# Patient Record
Sex: Female | Born: 1975 | Hispanic: Yes | Marital: Single | Smoking: Never smoker
Health system: Southern US, Community
[De-identification: ages and names within clinical notes are randomized; demographics above are authoritative.]

## PROBLEM LIST (undated history)

## (undated) DIAGNOSIS — R87629 Unspecified abnormal cytological findings in specimens from vagina: Secondary | ICD-10-CM

## (undated) HISTORY — DX: Unspecified abnormal cytological findings in specimens from vagina: R87.629

---

## 2021-07-07 ENCOUNTER — Other Ambulatory Visit (HOSPITAL_BASED_OUTPATIENT_CLINIC_OR_DEPARTMENT_OTHER): Payer: Self-pay

## 2021-07-07 DIAGNOSIS — Z1231 Encounter for screening mammogram for malignant neoplasm of breast: Secondary | ICD-10-CM

## 2021-08-13 ENCOUNTER — Other Ambulatory Visit (HOSPITAL_BASED_OUTPATIENT_CLINIC_OR_DEPARTMENT_OTHER): Payer: Self-pay | Admitting: Physician Assistant

## 2021-08-13 DIAGNOSIS — Z1231 Encounter for screening mammogram for malignant neoplasm of breast: Secondary | ICD-10-CM

## 2021-08-16 ENCOUNTER — Encounter (HOSPITAL_BASED_OUTPATIENT_CLINIC_OR_DEPARTMENT_OTHER): Payer: Self-pay

## 2021-08-16 ENCOUNTER — Encounter (HOSPITAL_BASED_OUTPATIENT_CLINIC_OR_DEPARTMENT_OTHER): Payer: Medicaid Other

## 2021-08-16 ENCOUNTER — Ambulatory Visit (HOSPITAL_BASED_OUTPATIENT_CLINIC_OR_DEPARTMENT_OTHER)
Admission: RE | Admit: 2021-08-16 | Discharge: 2021-08-16 | Disposition: A | Discharge status: Home / Self Care | Payer: Medicaid Other | Source: Ambulatory Visit | Attending: Physician Assistant | Admitting: Physician Assistant

## 2021-08-16 ENCOUNTER — Other Ambulatory Visit: Payer: Self-pay

## 2021-08-16 DIAGNOSIS — Z1231 Encounter for screening mammogram for malignant neoplasm of breast: Secondary | ICD-10-CM | POA: Diagnosis present

## 2021-08-25 ENCOUNTER — Ambulatory Visit (INDEPENDENT_AMBULATORY_CARE_PROVIDER_SITE_OTHER): Payer: Medicaid Other | Admitting: Obstetrics & Gynecology

## 2021-08-25 ENCOUNTER — Encounter: Payer: Self-pay | Admitting: Obstetrics & Gynecology

## 2021-08-25 ENCOUNTER — Other Ambulatory Visit: Payer: Self-pay

## 2021-08-25 VITALS — BP 113/68 | HR 89 | Ht 63.0 in | Wt 193.0 lb

## 2021-08-25 DIAGNOSIS — Z3009 Encounter for other general counseling and advice on contraception: Secondary | ICD-10-CM | POA: Diagnosis not present

## 2021-08-25 DIAGNOSIS — N814 Uterovaginal prolapse, unspecified: Secondary | ICD-10-CM | POA: Diagnosis not present

## 2021-08-25 DIAGNOSIS — Z01419 Encounter for gynecological examination (general) (routine) without abnormal findings: Secondary | ICD-10-CM

## 2021-08-25 DIAGNOSIS — N812 Incomplete uterovaginal prolapse: Secondary | ICD-10-CM

## 2021-08-25 NOTE — Patient Instructions (Signed)
Pelvic Organ Prolapse Pelvic organ prolapse is a condition in women that involves the stretching, bulging, or dropping of pelvic organs into an abnormal position, past the opening of the vagina. It happens when the muscles and tissues that surround and support pelvic structures become weak or stretched. Pelvic organ prolapse can involve the: Vagina (vaginal prolapse). Uterus (uterine prolapse). Bladder (cystocele). Rectum (rectocele). Intestines (enterocele). When organs other than the vagina are involved, they often bulge into thevagina or protrude from the vagina, depending on how severe the prolapse is. What are the causes? This condition may be caused by: Pregnancy, labor, and childbirth. Past pelvic surgery. Lower levels of the hormone estrogen due to menopause. Consistently lifting more than 50 lb (23 kg). Obesity. Long-term difficulty passing stool (chronic constipation). Long-term, or chronic, cough. Fluid buildup in the abdomen due to certain conditions. What are the signs or symptoms? Symptoms of this condition include: Leaking a little urine (loss of bladder control) when you cough, sneeze, strain, and exercise (stress incontinence). This may be worse immediately after childbirth. It may gradually improve over time. Feeling pressure in your pelvis or vagina. This pressure may increase when you cough or when you are passing stool. A bulge that protrudes from the opening of your vagina. Difficulty passing urine or stool. Pain in your lower back. Pain or discomfort during sex, or decreased interest in sex. Repeated bladder infections (urinary tract infections). Difficulty inserting a tampon. In some people, this condition causes no symptoms. How is this diagnosed? This condition may be diagnosed based on a vaginal and rectal exam. During the exam, you may be asked to cough and strain while you are lying down, sitting, and standing up. Your health care provider will determine if  other tests arerequired, such as bladder function tests. How is this treated? Treatment for this condition may depend on your symptoms. Treatment may include: Lifestyle changes, such as drinking plenty of fluids and eating foods that are high in fiber. Emptying your bladder at scheduled times (bladder training therapy). This can help reduce or avoid urinary incontinence. Estrogen. This may help mild prolapse by increasing the strength and tone of pelvic floor muscles. Kegel exercises. These may help mild cases of prolapse by strengthening and tightening the muscles of the pelvic floor. A soft, flexible device that helps support the vaginal walls and keep pelvic organs in place (pessary). This is inserted into your vagina by your health care provider. Surgery. This is often the only form of treatment for severe prolapse. Follow these instructions at home: Eating and drinking Avoid drinking beverages that contain caffeine or alcohol. Increase your intake of high-fiber foods to decrease constipation and straining during bowel movements. Activity Lose weight if recommended by your health care provider. Avoid heavy lifting and straining with exercise and work. Do not hold your breath when you perform mild to moderate lifting and exercise activities. Limit your activities as directed by your health care provider. Do Kegel exercises as directed by your health care provider. To do this: Squeeze your pelvic floor muscles tight. You should feel a tight lift in your rectal area and a tightness in your vaginal area. Keep your stomach, buttocks, and legs relaxed. Hold the muscles tight for up to 10 seconds. Then relax your muscles. Repeat this exercise 50 times a day, or as much as told by your health care provider. Continue to do this exercise for at least 4-6 weeks, or for as long as told by your health care provider. General instructions   Take over-the-counter and prescription medicines only as told by  your health care provider. Wear a sanitary pad or adult diapers if you have urinary incontinence. If you have a pessary, take care of it as told by your health care provider. Keep all follow-up visits. This is important. Contact a health care provider if you: Have symptoms that interfere with your daily activities or sex life. Need medicine to help with the discomfort. Notice bleeding from your vagina that is not related to your menstrual period. Have a fever. Have pain or bleeding when you urinate. Have bleeding when you pass stool. Pass urine when you have sex. Have chronic constipation. Have a pessary that falls out. Have a foul-smelling vaginal discharge. Have an unusual, low pain in your abdomen. Get help right away if you: Cannot pass urine. Summary Pelvic organ prolapse is the stretching, bulging, or dropping of pelvic organs into an abnormal position. It happens when the muscles and tissues that surround and support pelvic structures become weak or stretched. When organs other than the vagina are involved, they often bulge into the vagina or protrude from it, depending on how severe the prolapse is. In most cases, this condition needs to be treated only if it produces symptoms. Treatment may include lifestyle changes, estrogen, Kegel exercises, pessary insertion, or surgery. Avoid heavy lifting and straining with exercise and work. Do not hold your breath when you perform mild to moderate lifting and exercise activities. Limit your activities as directed by your health care provider. This information is not intended to replace advice given to you by your health care provider. Make sure you discuss any questions you have with your healthcare provider. Document Revised: 02/24/2020 Document Reviewed: 02/24/2020 Elsevier Patient Education  2022 [employer]  

## 2021-08-25 NOTE — Progress Notes (Signed)
Patient up to date on her mammogram (08-16-21). Armandina Stammer RN

## 2021-08-25 NOTE — Progress Notes (Signed)
Subjective:     Cathy Collins is a 45 y.o. female here for a routine exam.  She reports monthly cycles that are not bothersome. Pt is in a monogamous relationship for many years. She recently moved here from [place]  with her partner and 2 sons. She denies GYN issues. She does reports a prior h/o 'feeling something' from vagina prev. No h/o incontinence or AUB.     She is s/p SVD x2. Pt had PAP 07/2021 with primary care provider. She still wants an exam by GYN.     Gynecologic History Patient's last menstrual period was 08/01/2021 (exact date). Contraception:  occ condoms and 'being careful' Last Pap: 07/16/2021. Results were: normal Last mammogram: 06/16/2021. Results were: normal  Obstetric History [id number]   The following portions of the patient's history were reviewed and updated as appropriate: allergies, current medications, past family history, past medical history, past social history, past surgical history, and problem list.  Review of Systems Pertinent items are noted in HPI.    Objective:  BP 113/68    Pulse 89    Ht 5\' 3"  (1.6 m)    Wt 193 lb (87.5 kg)    LMP 08/01/2021 (Exact Date)    BMI 34.19 kg/m   General Appearance:    Alert, cooperative, no distress, appears stated age  Head:    Normocephalic, without obvious abnormality, atraumatic  Eyes:    conjunctiva/corneas clear, EOM's intact, both eyes  Ears:    Normal external ear canals, both ears  Nose:   Nares normal, septum midline, mucosa normal, no drainage    or sinus tenderness  Throat:   Lips, mucosa, and tongue normal; teeth and gums normal  Neck:   Supple, symmetrical, trachea midline, no adenopathy;    thyroid:  no enlargement/tenderness/nodules  Back:     Symmetric, no curvature, ROM normal, no CVA tenderness  Lungs:     respirations unlabored  Chest Wall:    No tenderness or deformity   Heart:    Regular rate and rhythm  Breast Exam:    No tenderness, masses, or nipple abnormality  Abdomen:     Soft, non-tender,  bowel sounds active all four quadrants,    no masses, no organomegaly  Genitalia:    Normal female without lesion, discharge or tenderness   Pt with stage II uterine prolapse. She has a cystocele that is visible through the introitus.    Extremities:   Extremities normal, atraumatic, no cyanosis or edema  Pulses:   2+ and symmetric all extremities  Skin:   Skin color, texture, turgor normal, no rashes or lesions     Assessment:    Healthy female exam.  Asymptomatic pelvic organ prolapse. Reviewed with pt management options briefly should sx develop.  Contraception counseling- reviewed with pt that she is still able to get pregnant.     Plan:  Nhu was seen today for gynecologic exam.  Diagnoses and all orders for this visit:  Well female exam with routine gynecological exam  First degree uterine prolapse  Cystocele with prolapse   Need to obtain results for PAP 07/2021  Simmone Cape L. Harraway-Smith, M.D., 08/2021

## 2021-09-15 ENCOUNTER — Encounter: Payer: Self-pay | Admitting: General Practice

## 2021-10-24 NOTE — Therapy (Signed)
OUTPATIENT PHYSICAL THERAPY THORACOLUMBAR EVALUATION   Patient Name: Cathy Collins MRN: [id number] DOB:March 19, 1976, 46 y.o., female Today's Date: 10/26/2021   PT End of Session - 10/26/21 1233     Visit Number 1    Date for PT Re-Evaluation 12/21/21    Authorization Type [employer]    Authorization - Visit Number 1    Authorization - Number of Visits 27    PT Start Time 1015    PT Stop Time 1100    PT Time Calculation (min) 45 min              History reviewed. No pertinent surgical history.       REFERRING PROVIDER: Rick Duff, PA-C  REFERRING DIAG: M54.59 (ICD-10-CM) - Other low back pain   THERAPY DIAG:  Chronic bilateral low back pain without sciatica  Abnormal posture  Muscle weakness (generalized)  Cramp and spasm  ONSET DATE: chronic, worse over past few months  SUBJECTIVE:                                                                                                                                                                                           SUBJECTIVE STATEMENT: Pt is a 46yo female referred to OPPT for LBP.  Pain has worsened over past few months.  No known injury.  Pain is mildine and spreads bil Lt>Rt, into buttock and posterior thigh.  Worse with being static, sitting or walking > 20 min, bending/lifting.  Pt is getting lumbar MRI following recent lumbar x-rays.  PERTINENT HISTORY:  Pelvic organ prolapse Stage 2, recently diagnosed, no treatment to date  PAIN:  Are you having pain? Yes NPRS scale: 4-8/10 Pain location: lumbar, buttocks, posterior thighs Pain orientation: Bilateral  PAIN TYPE: aching and sharp Pain description: constant  Aggravating factors: too much walking, bending, sitting, being too still then moving Relieving factors: rubbing my back gives short term relief, ibuprofen does not help  PRECAUTIONS: None  WEIGHT BEARING RESTRICTIONS No  FALLS:  Has patient fallen in last 6 months? No  LIVING  ENVIRONMENT: Lives with: lives with their family Lives in: House/apartment Stairs: Yes; external flight to enter level apartment Has following equipment at home: None  OCCUPATION: does not work due to pain, relocated 1.5 years ago  PLOF: Independent  PATIENT GOALS  reduce pain, manage pain   OBJECTIVE:   DIAGNOSTIC FINDINGS:  Lumbar xrays 10/21/2021: Mild disc space narrowing present at L2-3 and L3-4 with minimal associated vertebral body spurring. Mild lower facet arthrosis greatest at L4-5 and L5-S1   PATIENT SURVEYS:  FOTO 46%, goal 56%    COGNITION:  Overall  cognitive status: Within functional limits for tasks assessed     SENSATION:  Light touch: Appears intact    MUSCLE LENGTH: Hamstrings limited end range bil Piriformis limited 30% bil Gluteals limited 30% bil Quads limited 30% bil   POSTURE:  Decreased thoracic kyphosis, increased lumbar lordosis, anterior pelvic tilt  PALPATION: Tender bil lumbar paraspinals, QL, SI joints, glut max, glut med.  Spasm present along bil lumbar spine.  LUMBARAROM/PROM  A/PROM A/PROM  10/26/2021  Flexion Fingers to ground, no pain  Extension Full, excessive, no pain  Right lateral flexion Full, Rt sided pain  Left lateral flexion Full, Rt sided stretch  Right rotation Full no pain  Lt rotation Full, no pain   (Blank rows = not tested)  LE AROM/PROM:   Rt hip limited ER/IR compared to Lt, with pain   LE MMT:  MMT Right 10/26/2021 Left 10/26/2021  Hip flexion 3+ 3+  Hip extension 4 4  Hip abduction 3+ 4-  Hip adduction 4- 4-  Hip internal rotation 4- 4-  Hip external rotation 4 4  Knee flexion 4 4  Knee extension 4 4  Ankle dorsiflexion 4 4  Ankle plantarflexion    Ankle inversion    Ankle eversion     (Blank rows = not tested)  LUMBAR SPECIAL TESTS:  SLR negative bil, FABER + bil, SLS negative (able to balance, no Trendelenburg)  FUNCTIONAL TESTS:  Squat: able to perform 50% squat, with  LBP  GAIT:  Assistive device utilized: None Level of assistance: Complete Independence Comments: gait WNL    TODAY'S TREATMENT  Attempted exploring supine hip and LE stretches, quadruped stretches and spine ROM, and seated hamstring stretch but Pt reported pain and hesitancy to go home with HEP given pain.  She inquired about how recent Dx of pelvic organ prolapse may affect her pain and exercises.  Discussed how ortho PT can work indirectly with breath/pressure management and verbal cueing for core and pelvic floor contractions and if prolapse symptoms interfere or worsens she may benefit from pelvic PT.  Gave Pt dry needling info given palpable spasms along lumbar spine as an option for treatment next time.   PATIENT EDUCATION:  Education details: DN info Person educated: Patient Education method: Handouts Education comprehension: verbalized understanding   HOME EXERCISE PROGRAM: Start next time, gave DN info  ASSESSMENT:  CLINICAL IMPRESSION: Patient is a 46 y.o. female who was seen today for physical therapy evaluation and treatment for chronic and worsening LBP. Objective impairments include decreased knowledge of condition, decreased ROM, decreased strength, increased muscle spasms, impaired flexibility, postural dysfunction, and pain. These impairments are limiting patient from cleaning, community activity, laundry, yard work, shopping, and yard work. Personal factors including Fitness, Profession, Time since onset of injury/illness/exacerbation, and 1 comorbidity: pelvic organ prolapse  are also affecting patient's functional outcome. Patient will benefit from skilled PT to address above impairments and improve overall function.  REHAB POTENTIAL: Good  CLINICAL DECISION MAKING: Stable/uncomplicated  EVALUATION COMPLEXITY: Low   GOALS: Goals reviewed with patient? Yes  SHORT TERM GOALS:  STG Name Target Date Goal status  1 Pt will be able to demo proper understanding  of how to engage core in various positions and report success with pelvic floor lift with verbal cueing by PT Baseline:  11/16/2021 INITIAL  2 Pt will be ind with initial HEP to improve LE flexibility, LE strength, and core strength. Baseline:  11/23/2021 INITIAL  3 Pt will report reduced pain by at least 20%  with sitting, standing tasks, and walking x 30' or more. Baseline: 20 min sitting and standing  11/23/2021 INITIAL  4  Baseline:    5  Baseline:    6  Baseline:    7  Baseline:     LONG TERM GOALS:   LTG Name Target Date Goal status  1 Pt will be ind with advanced HEP and understand how to safely progress Baseline: 12/21/2021 INITIAL  2 Pt will report at least 70% improvement in tolerance of light to moderate household tasks. Baseline: 12/21/2021 INITIAL  3 Pt will improve LE strength and core strength to at least 4+/5 bil for improved squat, stairs, and gait endurance. Baseline: 12/21/2021 INITIAL  4 Pt able to demo proper bend/lift body mechanics without pain for household tasks and shopping. Baseline: 12/21/2021 INITIAL  5 Improve FOTO to at least 56% to demo improved function. Baseline: 46% 12/21/2021 INITIAL  6  Baseline:    7  Baseline:     PLAN: PT FREQUENCY: 2x/week  PT DURATION: 8 weeks  PLANNED INTERVENTIONS: Therapeutic exercises, Therapeutic activity, Neuro Muscular re-education, Balance training, Gait training, Patient/Family education, Joint mobilization, Dry Needling, Electrical stimulation, Spinal mobilization, Cryotherapy, Moist heat, Taping, and Manual therapy  PLAN FOR NEXT SESSION: NuStep, intro core in SL, include pelvic floor cues, quad/hip/hamstring stretches as tol, LE strength, DN lumbar spine if Pt ok with this (handout given)   Demetric Dunnaway, PT 10/26/21 1:02 PM

## 2021-10-26 ENCOUNTER — Other Ambulatory Visit: Payer: Self-pay

## 2021-10-26 ENCOUNTER — Ambulatory Visit: Payer: Medicaid Other | Attending: Physician Assistant | Admitting: Physical Therapy

## 2021-10-26 ENCOUNTER — Encounter: Payer: Self-pay | Admitting: Physical Therapy

## 2021-10-26 DIAGNOSIS — M5459 Other low back pain: Secondary | ICD-10-CM | POA: Diagnosis not present

## 2021-10-26 DIAGNOSIS — R293 Abnormal posture: Secondary | ICD-10-CM

## 2021-10-26 DIAGNOSIS — G8929 Other chronic pain: Secondary | ICD-10-CM

## 2021-10-26 DIAGNOSIS — R252 Cramp and spasm: Secondary | ICD-10-CM

## 2021-10-26 DIAGNOSIS — M545 Low back pain, unspecified: Secondary | ICD-10-CM

## 2021-10-26 DIAGNOSIS — M6281 Muscle weakness (generalized): Secondary | ICD-10-CM

## 2021-10-26 NOTE — Patient Instructions (Signed)

## 2021-10-26 NOTE — Addendum Note (Signed)
Addended by: Nathaneil Canary on: 10/26/2021 01:09 PM   Modules accepted: Orders

## 2021-11-02 NOTE — Therapy (Signed)
OUTPATIENT PHYSICAL THERAPY TREATMENT NOTE   Patient Name: Cathy Collins MRN: [id number] DOB:[birth year], 46 y.o., female Today's Date: 11/03/2021  PCP: [facility] REFERRING PROVIDER: [facility]   PT End of Session - 11/03/21 1111     Visit Number 2    Date for PT Re-Evaluation 12/21/21    Authorization Type [employer] Medicaid    Authorization - Visit Number 2    Authorization - Number of Visits 27    PT Start Time 1111   Pt late   PT Stop Time 1145    PT Time Calculation (min) 34 min    Activity Tolerance Patient tolerated treatment well    Behavior During Therapy WFL for tasks assessed/performed             Past Medical History:  Diagnosis Date   Vaginal Pap smear, abnormal    History reviewed. No pertinent surgical history. There are no problems to display for this patient.   REFERRING DIAG: M54.59 (ICD-10-CM) - Other low back pain   THERAPY DIAG:  Chronic bilateral low back pain without sciatica   Abnormal posture   Muscle weakness (generalized)   Cramp and spasm  PERTINENT HISTORY: Pelvic organ prolapse Stage 2, recently diagnosed, no treatment to date   PRECAUTIONS: none  SUBJECTIVE: I did some stretching on my own over the weekend and did ok.  I continue to limit my activity due to pain.  Iboprofen has been helping. Muscle relaxers give me some side effects so only used a few times.  PAIN:  Are you having pain? Yes NPRS scale: 4/10 Pain location: lumbar Pain orientation: Left  PAIN TYPE: aching Pain description: aching  Aggravating factors: activity Relieving factors: unsure, limiting activity    TODAY'S TREATMENT   11/03/2021: Pt education/self-care:  Education about anatomy and role of pelvic floor and deep core importance of pressure management and proper core engagement before progression of dynamic strength training to protect ongoing pelvic organ prolapse Diaphragmatic breathing intro Neuro-reed:  standing and  SL diaphragmatic breathing with VC and TC Add TA indraw and PF lift, VC used and PT used external palpation of posterior PF to validate proper lift with Pt's permission HEP: work on diaphragmatic breathing with TA/PF activation on exhale in variety of positions, can use self-palpation for feedback as instructed by PT  10/26/2021: Attempted exploring supine hip and LE stretches, quadruped stretches and spine ROM, and seated hamstring stretch but Pt reported pain and hesitancy to go home with HEP given pain.  She inquired about how recent Dx of pelvic organ prolapse may affect her pain and exercises.  Discussed how ortho PT can work indirectly with breath/pressure management and verbal cueing for core and pelvic floor contractions and if prolapse symptoms interfere or worsens she may benefit from pelvic PT.  Gave Pt dry needling info given palpable spasms along lumbar spine as an option for treatment next time.     PATIENT EDUCATION:  Education details: DN info Person educated: Patient Education method: Handouts Education comprehension: verbalized understanding     HOME EXERCISE PROGRAM: Start next time, gave DN info   ASSESSMENT:   CLINICAL IMPRESSION: Pt late so shorter session.  Session spent educating Pt on anatomy and role of pelvic floor and deep core muscles.  Pt was able to perform diaphragmatic breathing in standing and SL with cueing.  PT used a variety of cues for Pt to successfully find TA and PF to engage on exhale.  Pt understands she may use  self-palpation to determine whether she is properly engaging PF.  She notes she feels more contraction in posterior PF vs anteriorly.  She has active Gr II pelvic organ prolapse with history of leakage and understands if she is unable to progress with therex for LBP she may benefit from seeing PF PT.  She will benefit from progression to LE and trunk stretches and low level core added to TA/PF next time.   REHAB POTENTIAL: Good   CLINICAL  DECISION MAKING: Stable/uncomplicated   EVALUATION COMPLEXITY: Low     GOALS: Goals reviewed with patient? Yes   SHORT TERM GOALS:   STG Name Target Date Goal status  1 Pt will be able to demo proper understanding of how to engage core in various positions and report success with pelvic floor lift with verbal cueing by PT Baseline:  11/16/2021 INITIAL  2 Pt will be ind with initial HEP to improve LE flexibility, LE strength, and core strength. Baseline:  11/23/2021 INITIAL  3 Pt will report reduced pain by at least 20% with sitting, standing tasks, and walking x 30' or more. Baseline: 20 min sitting and standing  11/23/2021 INITIAL  4   Baseline:      5   Baseline:      6   Baseline:      7   Baseline:        LONG TERM GOALS:    LTG Name Target Date Goal status  1 Pt will be ind with advanced HEP and understand how to safely progress Baseline: 12/21/2021 INITIAL  2 Pt will report at least 70% improvement in tolerance of light to moderate household tasks. Baseline: 12/21/2021 INITIAL  3 Pt will improve LE strength and core strength to at least 4+/5 bil for improved squat, stairs, and gait endurance. Baseline: 12/21/2021 INITIAL  4 Pt able to demo proper bend/lift body mechanics without pain for household tasks and shopping. Baseline: 12/21/2021 INITIAL  5 Improve FOTO to at least 56% to demo improved function. Baseline: 46% 12/21/2021 INITIAL  6   Baseline:      7   Baseline:        PLAN: PT FREQUENCY: 2x/week   PT DURATION: 8 weeks   PLANNED INTERVENTIONS: Therapeutic exercises, Therapeutic activity, Neuro Muscular re-education, Balance training, Gait training, Patient/Family education, Joint mobilization, Dry Needling, Electrical stimulation, Spinal mobilization, Cryotherapy, Moist heat, Taping, and Manual therapy   PLAN FOR NEXT SESSION: f/u on TA and PF confidence, layer beginner core with TA/PF and intro Medbridge for LE and trunk gentle stretching   Jeff Frieden,  PT 11/03/21 1:37 PM

## 2021-11-03 ENCOUNTER — Ambulatory Visit: Payer: Medicaid Other | Admitting: Physical Therapy

## 2021-11-03 ENCOUNTER — Encounter: Payer: Self-pay | Admitting: Physical Therapy

## 2021-11-03 ENCOUNTER — Other Ambulatory Visit: Payer: Self-pay

## 2021-11-03 DIAGNOSIS — R252 Cramp and spasm: Secondary | ICD-10-CM

## 2021-11-03 DIAGNOSIS — R293 Abnormal posture: Secondary | ICD-10-CM

## 2021-11-03 DIAGNOSIS — M545 Low back pain, unspecified: Secondary | ICD-10-CM

## 2021-11-03 DIAGNOSIS — M6281 Muscle weakness (generalized): Secondary | ICD-10-CM

## 2021-11-03 DIAGNOSIS — M5459 Other low back pain: Secondary | ICD-10-CM | POA: Diagnosis not present

## 2021-11-03 DIAGNOSIS — G8929 Other chronic pain: Secondary | ICD-10-CM

## 2021-11-09 ENCOUNTER — Ambulatory Visit: Payer: Medicaid Other

## 2021-11-09 ENCOUNTER — Other Ambulatory Visit: Payer: Self-pay

## 2021-11-09 DIAGNOSIS — R252 Cramp and spasm: Secondary | ICD-10-CM

## 2021-11-09 DIAGNOSIS — M6281 Muscle weakness (generalized): Secondary | ICD-10-CM

## 2021-11-09 DIAGNOSIS — G8929 Other chronic pain: Secondary | ICD-10-CM

## 2021-11-09 DIAGNOSIS — R293 Abnormal posture: Secondary | ICD-10-CM

## 2021-11-09 DIAGNOSIS — M5459 Other low back pain: Secondary | ICD-10-CM | POA: Diagnosis not present

## 2021-11-09 NOTE — Therapy (Signed)
OUTPATIENT PHYSICAL THERAPY TREATMENT NOTE   Patient Name: Cathy Collins MRN: [id number] DOB:[birth year], 46 y.o., female Today's Date: 11/09/2021  PCP: [facility] REFERRING PROVIDER: [facility]   PT End of Session - 11/09/21 1058     Visit Number 3    Date for PT Re-Evaluation 12/21/21    Authorization Type [employer] Medicaid    Authorization - Visit Number 3    Authorization - Number of Visits 27    PT Start Time 1022    PT Stop Time 1058    PT Time Calculation (min) 36 min    Activity Tolerance Patient tolerated treatment well    Behavior During Therapy WFL for tasks assessed/performed              Past Medical History:  Diagnosis Date   Vaginal Pap smear, abnormal    History reviewed. No pertinent surgical history. There are no problems to display for this patient.   REFERRING DIAG: M54.59 (ICD-10-CM) - Other low back pain   THERAPY DIAG:  Chronic bilateral low back pain without sciatica   Abnormal posture   Muscle weakness (generalized)   Cramp and spasm  PERTINENT HISTORY: Pelvic organ prolapse Stage 2, recently diagnosed, no treatment to date   PRECAUTIONS: none  SUBJECTIVE: I am on my cycle and having more pain.  I'm working on my breathing.   PAIN:  Are you having pain? Yes NPRS scale: 7/10 Pain location: lumbar/gliuteals  Pain orientation: Left  PAIN TYPE: aching Pain description: aching  Aggravating factors: activity Relieving factors: unsure, limiting activity    TODAY'S TREATMENT  11/09/2021: Seated hamstring stretch 3x20 seconds  Seated piriformis 1x20 seconds  Supine piriformis- 2 positions 2x20 each Trunk rotation: 3x20 seconds  Supine clam: yellow band 2x10, ball squeeze 5" hold x10   PATIENT EDUCATION: Education details: Access Code: [email or web address] (see exercises listed below), use of meditation for stress/pain/relaxation Person educated: Patient Education method: Explanation, Demonstration, and  Handouts Education comprehension: verbalized understanding and returned demonstration   HOME EXERCISE PROGRAM: Access Code: [email or web address] URL: [email or web address] Date: 11/09/2021 Prepared by: Claiborne Billings  Exercises Supine Lower Trunk Rotation - 2 x daily - 7 x weekly - 1 sets - 5 reps - 20 hold Supine Figure 4 Piriformis Stretch - 2 x daily - 7 x weekly - 1 sets - 3 reps - 20 hold Seated Figure 4 Piriformis Stretch - 2 x daily - 7 x weekly - 1 sets - 3 reps - 20 hold Supine Piriformis Stretch with Leg Straight - 2 x daily - 7 x weekly - 1 sets - 3 reps Supine Hip Adduction Isometric with Ball - 2 x daily - 7 x weekly - 1 sets - 10 reps - 5 hold Supine Transversus Abdominis Bracing with Double Leg Fallout - 2 x daily - 7 x weekly - 1 sets - 10 reps    11/03/2021: Pt education/self-care:  Education about anatomy and role of pelvic floor and deep core importance of pressure management and proper core engagement before progression of dynamic strength training to protect ongoing pelvic organ prolapse Diaphragmatic breathing intro Neuro-reed:  standing and SL diaphragmatic breathing with VC and TC Add TA indraw and PF lift, VC used and PT used external palpation of posterior PF to validate proper lift with Pt's permission HEP: work on diaphragmatic breathing with TA/PF activation on exhale in variety of positions, can use self-palpation for feedback as instructed by PT  10/26/2021: Attempted exploring supine hip and LE stretches, quadruped stretches  and spine ROM, and seated hamstring stretch but Pt reported pain and hesitancy to go home with HEP given pain.  She inquired about how recent Dx of pelvic organ prolapse may affect her pain and exercises.  Discussed how ortho PT can work indirectly with breath/pressure management and verbal cueing for core and pelvic floor contractions and if prolapse symptoms interfere or worsens she may benefit from pelvic PT.  Gave Pt dry needling info  given palpable spasms along lumbar spine as an option for treatment next time.     PATIENT EDUCATION:  Education details: DN info Person educated: Patient Education method: Handouts Education comprehension: verbalized understanding     HOME EXERCISE PROGRAM: Start next time, gave DN info   ASSESSMENT:   CLINICAL IMPRESSION: Pt is working on [employer] and pelvic floor activation paired with breathing at home.  Pt arrived with 7/10 pain and reports that pain is worse with her monthly cycle.  PT initiated [place] program for gentle flexibility and gave options for gluteal flexibility in sitting and supine.  Progression of TA activation with clam shells and today.  Pt tolerated all exercise well today without increase in pain levels.  Pt is challenged by current level of exercise and requires verbal and tactile cues intermittently throughout session. Pt will continue to benefit from skilled PT to address core and gluteal strength, flexibility and tissue mobility to address pain.    REHAB POTENTIAL: Good   CLINICAL DECISION MAKING: Stable/uncomplicated   EVALUATION COMPLEXITY: Low     GOALS: Goals reviewed with patient? Yes   SHORT TERM GOALS:   STG Name Target Date Goal status  1 Pt will be able to demo proper understanding of how to engage core in various positions and report success with pelvic floor lift with verbal cueing by PT Baseline:  11/16/2021 INITIAL  2 Pt will be ind with initial HEP to improve LE flexibility, LE strength, and core strength. Baseline:  11/23/2021 INITIAL  3 Pt will report reduced pain by at least 20% with sitting, standing tasks, and walking x 30' or more. Baseline: 20 min sitting and standing  11/23/2021 INITIAL  4   Baseline:      5   Baseline:      6   Baseline:      7   Baseline:        LONG TERM GOALS:    LTG Name Target Date Goal status  1 Pt will be ind with advanced HEP and understand how to safely progress Baseline: 12/21/2021 INITIAL  2 Pt  will report at least 70% improvement in tolerance of light to moderate household tasks. Baseline: 12/21/2021 INITIAL  3 Pt will improve LE strength and core strength to at least 4+/5 bil for improved squat, stairs, and gait endurance. Baseline: 12/21/2021 INITIAL  4 Pt able to demo proper bend/lift body mechanics without pain for household tasks and shopping. Baseline: 12/21/2021 INITIAL  5 Improve FOTO to at least 56% to demo improved function. Baseline: 46% 12/21/2021 INITIAL  6   Baseline:      7   Baseline:        PLAN: PT FREQUENCY: 2x/week   PT DURATION: 8 weeks   PLANNED INTERVENTIONS: Therapeutic exercises, Therapeutic activity, Neuro Muscular re-education, Balance training, Gait training, Patient/Family education, Joint mobilization, Dry Needling, Electrical stimulation, Spinal mobilization, Cryotherapy, Moist heat, Taping, and Manual therapy   PLAN FOR NEXT SESSION: review HEP issued today, continue gentle core activation and flexibility.  Sigurd Sos, PT 11/09/21 10:59 AM

## 2021-11-16 ENCOUNTER — Ambulatory Visit: Payer: Medicaid Other | Attending: Physician Assistant

## 2021-11-16 ENCOUNTER — Other Ambulatory Visit: Payer: Self-pay

## 2021-11-16 DIAGNOSIS — R293 Abnormal posture: Secondary | ICD-10-CM | POA: Insufficient documentation

## 2021-11-16 DIAGNOSIS — G8929 Other chronic pain: Secondary | ICD-10-CM | POA: Diagnosis present

## 2021-11-16 DIAGNOSIS — R252 Cramp and spasm: Secondary | ICD-10-CM | POA: Insufficient documentation

## 2021-11-16 DIAGNOSIS — M545 Low back pain, unspecified: Secondary | ICD-10-CM | POA: Diagnosis not present

## 2021-11-16 DIAGNOSIS — M6281 Muscle weakness (generalized): Secondary | ICD-10-CM | POA: Diagnosis present

## 2021-11-16 NOTE — Therapy (Signed)
?OUTPATIENT PHYSICAL THERAPY TREATMENT NOTE ? ? ?Patient Name: Cathy Collins ?MRN: [id number] ?DOB:1975/11/25, 46 y.o., female ?Today's Date: 11/16/2021 ? ?PCP: [facility] ?REFERRING PROVIDER: [facility] ? ? PT End of Session - 11/16/21 1056   ? ? Visit Number 4   ? Date for PT Re-Evaluation 12/21/21   ? Authorization Type [employer] Medicaid   ? Authorization - Visit Number 4   ? Authorization - Number of Visits 27   ? PT Start Time 1021   ? PT Stop Time 1057   ? PT Time Calculation (min) 36 min   ? Activity Tolerance Patient tolerated treatment well   ? Behavior During Therapy [facility] for tasks assessed/performed   ? ?  ?  ? ?  ? ? ? ? ?Past Medical History:  ?Diagnosis Date  ? Vaginal Pap smear, abnormal   ? ?History reviewed. No pertinent surgical history. ?There are no problems to display for this patient. ? ? ?REFERRING DIAG: M54.59 (ICD-10-CM) - Other low back pain  ? ?THERAPY DIAG:  ?Chronic bilateral low back pain without sciatica ?  ?Abnormal posture ?  ?Muscle weakness (generalized) ?  ?Cramp and spasm ? ?PERTINENT HISTORY: Pelvic organ prolapse Stage 2, recently diagnosed, no treatment to date  ? ?PRECAUTIONS: none ? ?SUBJECTIVE: I am getting spasms in the low back and along the front.  They are constant.  I am able to do the stretching.  ? ?PAIN:  ?Are you having pain? Yes ?NPRS scale: 7/10 ?Pain location: lumbar/gliuteals  ?Pain orientation: Left  ?PAIN TYPE: aching ?Pain description: aching  ?Aggravating factors: activity ?Relieving factors: unsure, limiting activity ? ? ? ?TODAY'S TREATMENT  ?11/16/2021: ?Seated hamstring stretch 3x20 seconds  ?Seated piriformis 1x20 seconds  ?Supine piriformis- 2 positions 2x20 each ?Trunk rotation: 3x20 seconds  ?Trigger Point Dry-Needling #1 ?Treatment instructions: Expect mild to moderate muscle soreness. S/S of pneumothorax if dry needled over a lung field, and to seek immediate medical attention should they occur. Patient verbalized understanding of  these instructions and education. ? ?Patient Consent Given: Yes ?Education handout provided: Yes ?Muscles treated: lumbar multifidi and bil gluteals  ?Treatment response/outcome: Twitch response elicited and Palpable decrease in muscle tension  ?Manual therapy: skilled palpation and monitoring with DN.  Elongation and trigger point release after DN.   ?PATIENT EDUCATION: ?Education details: DN info ?Person educated: Patient ?Education method: Explanation, Handouts ?Education comprehension: verbalized understanding  ?11/09/2021: ?Seated hamstring stretch 3x20 seconds  ?Seated piriformis 1x20 seconds  ?Supine piriformis- 2 positions 2x20 each ?Trunk rotation: 3x20 seconds  ?Supine clam: yellow band 2x10, ball squeeze 5" hold x10  ? ?PATIENT EDUCATION: ?Education details: Access Code: [email or web address] (see exercises listed below), use of meditation for stress/pain/relaxation ?Person educated: Patient ?Education method: Explanation, Demonstration, and Handouts ?Education comprehension: verbalized understanding and returned demonstration ? ? ?HOME EXERCISE PROGRAM: ?Access Code: [email or web address] ?URL: [email or web address] ?Date: 11/09/2021 ?Prepared by: Tresa Endo ? ?Exercises ?Supine Lower Trunk Rotation - 2 x daily - 7 x weekly - 1 sets - 5 reps - 20 hold ?Supine Figure 4 Piriformis Stretch - 2 x daily - 7 x weekly - 1 sets - 3 reps - 20 hold ?Seated Figure 4 Piriformis Stretch - 2 x daily - 7 x weekly - 1 sets - 3 reps - 20 hold ?Supine Piriformis Stretch with Leg Straight - 2 x daily - 7 x weekly - 1 sets - 3 reps ?Supine Hip Adduction Isometric with Ball - 2 x daily - 7 x weekly - 1 sets -  10 reps - 5 hold ?Supine Transversus Abdominis Bracing with Double Leg Fallout - 2 x daily - 7 x weekly - 1 sets - 10 reps ?  ? ?11/03/2021: ?Pt education/self-care:  ?Education about anatomy and role of pelvic floor and deep core ?importance of pressure management and proper core engagement before progression of dynamic strength training  to protect ongoing pelvic organ prolapse ?Diaphragmatic breathing intro ?Neuro-reed:  ?standing and SL diaphragmatic breathing with VC and TC ?Add TA indraw and PF lift, VC used and PT used external palpation of posterior PF to validate proper lift with Pt's permission ?HEP: work on diaphragmatic breathing with TA/PF activation on exhale in variety of positions, can use self-palpation for feedback as instructed by PT ? ?  ?HOME EXERCISE PROGRAM: ?Access Code: [email or web address], gave DN info ?  ?ASSESSMENT: ?  ?CLINICAL IMPRESSION: ?Pt is working on TA and pelvic floor activation paired with breathing at home.  Pt arrived with 5/10 pain today and reports spasms in lumbar and gluteal regions.  Pt is receptive to DN today to address spasms.  Pt with good response to manual therapy with improved tissue mobility and reduced pain and spasms after today.   Pt is challenged by current level of exercise and demonstrated all aspects of stretching with improved mobility today. Pt will continue to benefit from skilled PT to address core and gluteal strength, flexibility and tissue mobility to address pain.  ?  ?REHAB POTENTIAL: Good ?  ?CLINICAL DECISION MAKING: Stable/uncomplicated ?  ?EVALUATION COMPLEXITY: Low ?  ?  ?GOALS: ?Goals reviewed with patient? Yes ?  ?SHORT TERM GOALS: ?  ?STG Name Target Date Goal status  ?1 Pt will be able to demo proper understanding of how to engage core in various positions and report success with pelvic floor lift with verbal cueing by PT ?Baseline:  11/16/2021 INITIAL  ?2 Pt will be ind with initial HEP to improve LE flexibility, LE strength, and core strength. ?Baseline:  11/23/2021 INITIAL  ?3 Pt will report reduced pain by at least 20% with sitting, standing tasks, and walking x 30' or more. ?Baseline: 20 min sitting and standing  11/23/2021 INITIAL  ?4   ?Baseline:      ?5   ?Baseline:      ?6   ?Baseline:      ?7   ?Baseline:      ?  ?LONG TERM GOALS:  ?  ?LTG Name Target Date Goal status  ?1 Pt  will be ind with advanced HEP and understand how to safely progress ?Baseline: 12/21/2021 INITIAL  ?2 Pt will report at least 70% improvement in tolerance of light to moderate household tasks. ?Baseline: 12/21/2021 INITIAL  ?3 Pt will improve LE strength and core strength to at least 4+/5 bil for improved squat, stairs, and gait endurance. ?Baseline: 12/21/2021 INITIAL  ?4 Pt able to demo proper bend/lift body mechanics without pain for household tasks and shopping. ?Baseline: 12/21/2021 INITIAL  ?5 Improve FOTO to at least 56% to demo improved function. ?Baseline: 46% 12/21/2021 INITIAL  ?6   ?Baseline:      ?7   ?Baseline:      ?  ?PLAN: ?PT FREQUENCY: 2x/week ?  ?PT DURATION: 8 weeks ?  ?PLANNED INTERVENTIONS: Therapeutic exercises, Therapeutic activity, Neuro Muscular re-education, Balance training, Gait training, Patient/Family education, Joint mobilization, Dry Needling, Electrical stimulation, Spinal mobilization, Cryotherapy, Moist heat, Taping, and Manual therapy ?  ?PLAN FOR NEXT SESSION: Assess response to DN #1 continue gentle core activation and  flexibility.   ? ? ?Lorrene Reid, PT ?11/16/21 10:58 AM  ? ? ?  ? ?

## 2021-11-16 NOTE — Patient Instructions (Signed)

## 2021-11-18 ENCOUNTER — Other Ambulatory Visit: Payer: Self-pay

## 2021-11-18 ENCOUNTER — Ambulatory Visit: Payer: Medicaid Other | Admitting: Physical Therapy

## 2021-11-18 ENCOUNTER — Encounter: Payer: Self-pay | Admitting: Physical Therapy

## 2021-11-18 DIAGNOSIS — M545 Low back pain, unspecified: Secondary | ICD-10-CM

## 2021-11-18 DIAGNOSIS — R252 Cramp and spasm: Secondary | ICD-10-CM

## 2021-11-18 DIAGNOSIS — R293 Abnormal posture: Secondary | ICD-10-CM

## 2021-11-18 DIAGNOSIS — M6281 Muscle weakness (generalized): Secondary | ICD-10-CM

## 2021-11-18 NOTE — Therapy (Signed)
?OUTPATIENT PHYSICAL THERAPY TREATMENT NOTE ? ? ?Patient Name: Cathy Collins ?MRN: [id number] ?DOB:1975/12/29, 46 y.o., female ?Today's Date: 11/18/2021 ? ?PCP: [facility] ?REFERRING PROVIDER: Rick Duff, PA-C ? ? PT End of Session - 11/18/21 1107   ? ? Visit Number 5   ? Date for PT Re-Evaluation 12/21/21   ? Authorization Type [employer] Medicaid   ? Authorization - Visit Number 5   ? Authorization - Number of Visits 27   ? PT Start Time 1106   ? PT Stop Time 1143   ? PT Time Calculation (min) 37 min   ? Activity Tolerance Patient tolerated treatment well   ? Behavior During Therapy [facility] for tasks assessed/performed   ? ?  ?  ? ?  ? ? ? ? ?Past Medical History:  ?Diagnosis Date  ? Vaginal Pap smear, abnormal   ? ?History reviewed. No pertinent surgical history. ?There are no problems to display for this patient. ? ? ?REFERRING DIAG: M54.59 (ICD-10-CM) - Other low back pain  ? ?THERAPY DIAG:  ?Chronic bilateral low back pain without sciatica ?  ?Abnormal posture ?  ?Muscle weakness (generalized) ?  ?Cramp and spasm ? ?PERTINENT HISTORY: Pelvic organ prolapse Stage 2, recently diagnosed, no treatment to date  ? ?PRECAUTIONS: none ? ?SUBJECTIVE: Very sore after DN, but do note less spasms today.  The stretches are going well but I don't feel different when I do them. ? ?PAIN:  ?Are you having pain? Yes ?NPRS scale: 4/10 ?Pain location: lumbar/gliuteals  ?Pain orientation: Left  ?PAIN TYPE: aching ?Pain description: aching  ?Aggravating factors: activity ?Relieving factors: unsure, limiting activity ? ? ? ?TODAY'S TREATMENT  ?11/18/21: ?Seated hamstring stretch 2x20  ?Seated on ball pelvic ROM: 10x each circles Rt/Lt, tail wags, tucks ?Supine piriformis- 2 positions 2x20 each ?Supine pelvic tilt x10 with exhale on tuck ?Supine heel slide in pelvic tilt x 10 Rt/Lt ?Quadruped cat/cow x 10 ?Quadruped rocking Rt/Lt to open posterior hips bil x 3 each way ?Sit to stand from elevated surface 20" mat table with cue  for hip hinge and exhale on stand x 5 reps ?Sitting on ball red band bil UE extension x 10 reps, focus on TA ? ? ?11/16/2021: ?Seated hamstring stretch 3x20 seconds  ?Seated piriformis 1x20 seconds  ?Supine piriformis- 2 positions 2x20 each ?Trunk rotation: 3x20 seconds  ?Trigger Point Dry-Needling #1 ?Treatment instructions: Expect mild to moderate muscle soreness. S/S of pneumothorax if dry needled over a lung field, and to seek immediate medical attention should they occur. Patient verbalized understanding of these instructions and education. ? ?Patient Consent Given: Yes ?Education handout provided: Yes ?Muscles treated: lumbar multifidi and bil gluteals  ?Treatment response/outcome: Twitch response elicited and Palpable decrease in muscle tension  ?Manual therapy: skilled palpation and monitoring with DN.  Elongation and trigger point release after DN.   ?PATIENT EDUCATION: ?Education details: DN info ?Person educated: Patient ?Education method: Explanation, Handouts ?Education comprehension: verbalized understanding  ?11/09/2021: ?Seated hamstring stretch 3x20 seconds  ?Seated piriformis 1x20 seconds  ?Supine piriformis- 2 positions 2x20 each ?Trunk rotation: 3x20 seconds  ?Supine clam: yellow band 2x10, ball squeeze 5" hold x10  ? ?PATIENT EDUCATION: ?Education details: Access Code: [email or web address] (see exercises listed below), use of meditation for stress/pain/relaxation ?Person educated: Patient ?Education method: Explanation, Demonstration, and Handouts ?Education comprehension: verbalized understanding and returned demonstration ? ? ?HOME EXERCISE PROGRAM: ?Access Code: [email or web address] ?URL: [email or web address] ?Date: 11/09/2021 ?Prepared by: Tresa Endo ? ?Exercises ?Supine Lower Trunk Rotation - 2 x  daily - 7 x weekly - 1 sets - 5 reps - 20 hold ?Supine Figure 4 Piriformis Stretch - 2 x daily - 7 x weekly - 1 sets - 3 reps - 20 hold ?Seated Figure 4 Piriformis Stretch - 2 x daily - 7 x weekly - 1 sets - 3 reps  - 20 hold ?Supine Piriformis Stretch with Leg Straight - 2 x daily - 7 x weekly - 1 sets - 3 reps ?Supine Hip Adduction Isometric with Ball - 2 x daily - 7 x weekly - 1 sets - 10 reps - 5 hold ?Supine Transversus Abdominis Bracing with Double Leg Fallout - 2 x daily - 7 x weekly - 1 sets - 10 reps ?  ? ?11/03/2021: ?Pt education/self-care:  ?Education about anatomy and role of pelvic floor and deep core ?importance of pressure management and proper core engagement before progression of dynamic strength training to protect ongoing pelvic organ prolapse ?Diaphragmatic breathing intro ?Neuro-reed:  ?standing and SL diaphragmatic breathing with VC and TC ?Add TA indraw and PF lift, VC used and PT used external palpation of posterior PF to validate proper lift with Pt's permission ?HEP: work on diaphragmatic breathing with TA/PF activation on exhale in variety of positions, can use self-palpation for feedback as instructed by PT ? ?  ?HOME EXERCISE PROGRAM: ?Access Code: [email or web address], gave DN info ?  ?ASSESSMENT: ?  ?CLINICAL IMPRESSION: ?Pt arrived with reduced pain 4/10 compared to 7/10 last visit.  She was very sore from DN but was back to baseline today.  PT reviewed selected stretching and advanced spine ROM and core activations in a variety of positions with good tolerance today.  Pt had some light bulb moments where she felt more awareness of her deep core activations.  She was able to ind perform sit to stand from 20" surface without Ues today but unable to from chair height.  Continue along POC.   ?  ?REHAB POTENTIAL: Good ?  ?CLINICAL DECISION MAKING: Stable/uncomplicated ?  ?EVALUATION COMPLEXITY: Low ?  ?  ?GOALS: ?Goals reviewed with patient? Yes ?  ?SHORT TERM GOALS: ?  ?STG Name Target Date Goal status  ?1 Pt will be able to demo proper understanding of how to engage core in various positions and report success with pelvic floor lift with verbal cueing by PT ?Baseline:  11/16/2021 INITIAL  ?2 Pt will be ind with  initial HEP to improve LE flexibility, LE strength, and core strength. ?Baseline:  11/23/2021 INITIAL  ?3 Pt will report reduced pain by at least 20% with sitting, standing tasks, and walking x 30' or more. ?Baseline: 20 min sitting and standing  11/23/2021 INITIAL  ?4   ?Baseline:      ?5   ?Baseline:      ?6   ?Baseline:      ?7   ?Baseline:      ?  ?LONG TERM GOALS:  ?  ?LTG Name Target Date Goal status  ?1 Pt will be ind with advanced HEP and understand how to safely progress ?Baseline: 12/21/2021 INITIAL  ?2 Pt will report at least 70% improvement in tolerance of light to moderate household tasks. ?Baseline: 12/21/2021 INITIAL  ?3 Pt will improve LE strength and core strength to at least 4+/5 bil for improved squat, stairs, and gait endurance. ?Baseline: 12/21/2021 INITIAL  ?4 Pt able to demo proper bend/lift body mechanics without pain for household tasks and shopping. ?Baseline: 12/21/2021 INITIAL  ?5 Improve FOTO to at least 56% to  demo improved function. ?Baseline: 46% 12/21/2021 INITIAL  ?6   ?Baseline:      ?7   ?Baseline:      ?  ?PLAN: ?PT FREQUENCY: 2x/week ?  ?PT DURATION: 8 weeks ?  ?PLANNED INTERVENTIONS: Therapeutic exercises, Therapeutic activity, Neuro Muscular re-education, Balance training, Gait training, Patient/Family education, Joint mobilization, Dry Needling, Electrical stimulation, Spinal mobilization, Cryotherapy, Moist heat, Taping, and Manual therapy ?  ?PLAN FOR NEXT SESSION: Assess response to last session, DN #2 if PT ok with this, continue core progression as tolerated ? ? ?Morton Peters, PT ?11/18/21 11:44 AM ? ? ? ?  ? ?

## 2021-11-23 ENCOUNTER — Ambulatory Visit: Payer: Medicaid Other | Admitting: Physical Therapy

## 2021-11-23 ENCOUNTER — Encounter: Payer: Self-pay | Admitting: Physical Therapy

## 2021-11-23 ENCOUNTER — Other Ambulatory Visit: Payer: Self-pay

## 2021-11-23 DIAGNOSIS — R293 Abnormal posture: Secondary | ICD-10-CM

## 2021-11-23 DIAGNOSIS — R252 Cramp and spasm: Secondary | ICD-10-CM

## 2021-11-23 DIAGNOSIS — M6281 Muscle weakness (generalized): Secondary | ICD-10-CM

## 2021-11-23 DIAGNOSIS — M545 Low back pain, unspecified: Secondary | ICD-10-CM | POA: Diagnosis not present

## 2021-11-23 NOTE — Therapy (Signed)
?OUTPATIENT PHYSICAL THERAPY TREATMENT NOTE ? ? ?Patient Name: Cathy Collins ?MRN: [id number] ?DOB:06-Jul-1976, 46 y.o., female ?Today's Date: 11/23/2021 ? ?PCP: [facility], [facility] ?REFERRING PROVIDER: Doyle Askew, PA-C ? ? PT End of Session - 11/23/21 1020   ? ? Visit Number 6  ? Date for PT Re-Evaluation 12/21/21   ? Authorization Type [employer] Medicaid   ? Authorization - Visit Number 6  ? Authorization - Number of Visits 27   ? PT Start Time 1020 ?Finish time 1057 ?Total treatment 38 min  ? Activity Tolerance Patient tolerated treatment well   ? Behavior During Therapy [facility] for tasks assessed/performed   ? ?  ?  ? ?  ? ? ? ? ?Past Medical History:  ?Diagnosis Date  ? Vaginal Pap smear, abnormal   ? ?History reviewed. No pertinent surgical history. ?There are no problems to display for this patient. ? ? ?REFERRING DIAG: M54.59 (ICD-10-CM) - Other low back pain  ? ?THERAPY DIAG:  ?Chronic bilateral low back pain without sciatica ?  ?Abnormal posture ?  ?Muscle weakness (generalized) ?  ?Cramp and spasm ? ?PERTINENT HISTORY: Pelvic organ prolapse Stage 2, recently diagnosed, no treatment to date  ? ?PRECAUTIONS: none ? ?SUBJECTIVE: Pain fluctuated over the weekend.  Doing the stretches and exercises from last week.  I have been referred to spine specialist given new lumbar MRI findings. ? ?PAIN:  ?Are you having pain? Yes ?NPRS scale: 6/10 ?Pain location: lumbar/gliuteals  ?Pain orientation: central lumbar ?PAIN TYPE: aching, cramping ?Pain description: aching  ?Aggravating factors: activity ?Relieving factors: unsure, limiting activity ? ? ? ?TODAY'S TREATMENT  ?11/23/21: ?Trigger Point Dry-Needling #2 ?Treatment instructions: Expect mild to moderate muscle soreness. S/S of pneumothorax if dry needled over a lung field, and to seek immediate medical attention should they occur. Patient verbalized understanding of these instructions and education. ?Patient Consent Given: Yes ?Education handout provided:  Previously provided ?Muscles treated: lumbar multifidi bil ?Electrical stimulation performed: No ?Parameters: N/A ?Treatment response/outcome: twitch and elongation bil ?Manual therapy: STM lumbar paraspinals, gluteals and mutlifdi after DN ?IFC estim lumbar with lumbar heat x 15', 20v ? ? ?11/18/21: ?Seated hamstring stretch 2x20  ?Seated on ball pelvic ROM: 10x each circles Rt/Lt, tail wags, tucks ?Supine piriformis- 2 positions 2x20 each ?Supine pelvic tilt x10 with exhale on tuck ?Supine heel slide in pelvic tilt x 10 Rt/Lt ?Quadruped cat/cow x 10 ?Quadruped rocking Rt/Lt to open posterior hips bil x 3 each way ?Sit to stand from elevated surface 20" mat table with cue for hip hinge and exhale on stand x 5 reps ?Sitting on ball red band bil UE extension x 10 reps, focus on TA ? ? ?11/16/2021: ?Seated hamstring stretch 3x20 seconds  ?Seated piriformis 1x20 seconds  ?Supine piriformis- 2 positions 2x20 each ?Trunk rotation: 3x20 seconds  ?Trigger Point Dry-Needling #1 ?Treatment instructions: Expect mild to moderate muscle soreness. S/S of pneumothorax if dry needled over a lung field, and to seek immediate medical attention should they occur. Patient verbalized understanding of these instructions and education. ? ?Patient Consent Given: Yes ?Education handout provided: Yes ?Muscles treated: lumbar multifidi and bil gluteals  ?Treatment response/outcome: Twitch response elicited and Palpable decrease in muscle tension  ?Manual therapy: skilled palpation and monitoring with DN.  Elongation and trigger point release after DN.   ? ? ?PATIENT EDUCATION: ?Education details: discussion of lumbar MRI, benefits and use of estim for pain, option for portable estim unit ?Person educated: Patient ?Education method: Explanation, Handouts ?Education comprehension:  verbalized understanding  ?11/09/2021: ?Seated hamstring stretch 3x20 seconds  ?Seated piriformis 1x20 seconds  ?Supine piriformis- 2 positions 2x20 each ?Trunk rotation:  3x20 seconds  ?Supine clam: yellow band 2x10, ball squeeze 5" hold x10  ? ?PATIENT EDUCATION: ?Education details: Access Code: [email or web address] (see exercises listed below), use of meditation for stress/pain/relaxation ?Person educated: Patient ?Education method: Explanation, Demonstration, and Handouts ?Education comprehension: verbalized understanding and returned demonstration ? ? ?HOME EXERCISE PROGRAM: ?Access Code: [email or web address] ?URL: [email or web address] ?Date: 11/09/2021 ?Prepared by: Claiborne Billings ? ?Exercises ?Supine Lower Trunk Rotation - 2 x daily - 7 x weekly - 1 sets - 5 reps - 20 hold ?Supine Figure 4 Piriformis Stretch - 2 x daily - 7 x weekly - 1 sets - 3 reps - 20 hold ?Seated Figure 4 Piriformis Stretch - 2 x daily - 7 x weekly - 1 sets - 3 reps - 20 hold ?Supine Piriformis Stretch with Leg Straight - 2 x daily - 7 x weekly - 1 sets - 3 reps ?Supine Hip Adduction Isometric with Ball - 2 x daily - 7 x weekly - 1 sets - 10 reps - 5 hold ?Supine Transversus Abdominis Bracing with Double Leg Fallout - 2 x daily - 7 x weekly - 1 sets - 10 reps ?  ? ?11/03/2021: ?Pt education/self-care:  ?Education about anatomy and role of pelvic floor and deep core ?importance of pressure management and proper core engagement before progression of dynamic strength training to protect ongoing pelvic organ prolapse ?Diaphragmatic breathing intro ?Neuro-reed:  ?standing and SL diaphragmatic breathing with VC and TC ?Add TA indraw and PF lift, VC used and PT used external palpation of posterior PF to validate proper lift with Pt's permission ?HEP: work on diaphragmatic breathing with TA/PF activation on exhale in variety of positions, can use self-palpation for feedback as instructed by PT ? ?  ?HOME EXERCISE PROGRAM: ?Access Code: [email or web address], gave DN info ?  ?ASSESSMENT: ?  ?CLINICAL IMPRESSION: ?Pt arrived with heightened pain 6/10 this morning, which fluctuated over the weekend, increasing with activity.  She is compliant with  HEP and feels the exercises help somewhat.  She had a lumbar MRI and has been referred to specialist.  PT repeated DN #2 to lumbar spine today followed by trial of IFC and heat given ongoing spasm and high pain levels.  PT held on DN to gluteals as pain was centered in lumbar spine and PT did not palpate any TP in gluteals.  Continue along POC with ongoing assessment of response to PT. ?  ?REHAB POTENTIAL: Good ?  ?CLINICAL DECISION MAKING: Stable/uncomplicated ?  ?EVALUATION COMPLEXITY: Low ?  ?  ?GOALS: ?Goals reviewed with patient? Yes ?  ?SHORT TERM GOALS: ?  ?STG Name Target Date Goal status  ?1 Pt will be able to demo proper understanding of how to engage core in various positions and report success with pelvic floor lift with verbal cueing by PT ?Baseline:  11/16/2021 INITIAL  ?2 Pt will be ind with initial HEP to improve LE flexibility, LE strength, and core strength. ?Baseline:  11/23/2021 INITIAL  ?3 Pt will report reduced pain by at least 20% with sitting, standing tasks, and walking x 30' or more. ?Baseline: 20 min sitting and standing  11/23/2021 INITIAL  ?4   ?Baseline:      ?5   ?Baseline:      ?6   ?Baseline:      ?7   ?Baseline:      ?  ?LONG TERM GOALS:  ?  ?  LTG Name Target Date Goal status  ?1 Pt will be ind with advanced HEP and understand how to safely progress ?Baseline: 12/21/2021 INITIAL  ?2 Pt will report at least 70% improvement in tolerance of light to moderate household tasks. ?Baseline: 12/21/2021 INITIAL  ?3 Pt will improve LE strength and core strength to at least 4+/5 bil for improved squat, stairs, and gait endurance. ?Baseline: 12/21/2021 INITIAL  ?4 Pt able to demo proper bend/lift body mechanics without pain for household tasks and shopping. ?Baseline: 12/21/2021 INITIAL  ?5 Improve FOTO to at least 56% to demo improved function. ?Baseline: 46% 12/21/2021 INITIAL  ?6   ?Baseline:      ?7   ?Baseline:      ?  ?PLAN: ?PT FREQUENCY: 2x/week ?  ?PT DURATION: 8 weeks ?  ?PLANNED INTERVENTIONS:  Therapeutic exercises, Therapeutic activity, Neuro Muscular re-education, Balance training, Gait training, Patient/Family education, Joint mobilization, Dry Needling, Electrical stimulation, Spinal mobilizati

## 2021-11-25 ENCOUNTER — Ambulatory Visit: Payer: Medicaid Other

## 2021-11-25 ENCOUNTER — Other Ambulatory Visit: Payer: Self-pay

## 2021-11-25 DIAGNOSIS — R293 Abnormal posture: Secondary | ICD-10-CM

## 2021-11-25 DIAGNOSIS — M545 Low back pain, unspecified: Secondary | ICD-10-CM | POA: Diagnosis not present

## 2021-11-25 DIAGNOSIS — M6281 Muscle weakness (generalized): Secondary | ICD-10-CM

## 2021-11-25 DIAGNOSIS — R252 Cramp and spasm: Secondary | ICD-10-CM

## 2021-11-25 NOTE — Therapy (Signed)
?OUTPATIENT PHYSICAL THERAPY TREATMENT NOTE ? ? ?Patient Name: Cathy Collins ?MRN: [id number] ?DOB:[birth year], 46 y.o., female ?Today's Date: 11/25/2021 ? ?PCP: [facility] ?REFERRING PROVIDER: Group, [place] ? ? PT End of Session - 11/23/21 1020   ? ? Visit Number 6  ? Date for PT Re-Evaluation 12/21/21   ? Authorization Type [employer] Medicaid   ? Authorization - Visit Number 6  ? Authorization - Number of Visits 27   ? PT Start Time 1020 ?Finish time 1057 ?Total treatment 38 min  ? Activity Tolerance Patient tolerated treatment well   ? Behavior During Therapy [facility] for tasks assessed/performed   ? ?  ?  ? ?  ? ? ? ? ?Past Medical History:  ?Diagnosis Date  ? Vaginal Pap smear, abnormal   ? ?History reviewed. No pertinent surgical history. ?There are no problems to display for this patient. ? ? ?REFERRING DIAG: M54.59 (ICD-10-CM) - Other low back pain  ? ?THERAPY DIAG:  ?Chronic bilateral low back pain without sciatica ?  ?Abnormal posture ?  ?Muscle weakness (generalized) ?  ?Cramp and spasm ? ?PERTINENT HISTORY: Pelvic organ prolapse Stage 2, recently diagnosed, no treatment to date  ? ?PRECAUTIONS: none ? ?SUBJECTIVE: I fell better after each session and then the pain returns.   ?PAIN:  ?Are you having pain? Yes ?NPRS scale: 5/10 ?Pain location: lumbar/gliuteals  ?Pain orientation: central lumbar ?PAIN TYPE: aching, cramping ?Pain description: aching  ?Aggravating factors: activity ?Relieving factors: unsure, limiting activity ? ? ? ?TODAY'S TREATMENT  ?Treatment on date: 11/25/21 ?NuStep: Level 2x 6 minutes-PT present to discuss progress ?Trunk rotation: 3x20" bil.  ?Supine pelvic tilt with exhale on tuck x10 ?Ball squeeze with TA activation 5" hold x 15 ?Hip abduction: yellow band with TA activation in supine x15 ? IFC estim lumbar with lumbar heat x 15' ?11/23/21: ?Trigger Point Dry-Needling #2 ?Treatment instructions: Expect mild to moderate muscle soreness. S/S of pneumothorax if dry needled  over a lung field, and to seek immediate medical attention should they occur. Patient verbalized understanding of these instructions and education. ?Patient Consent Given: Yes ?Education handout provided: Previously provided ?Muscles treated: lumbar multifidi bil ?Electrical stimulation performed: No ?Parameters: N/A ?Treatment response/outcome: twitch and elongation bil ?Manual therapy: STM lumbar paraspinals, gluteals and mutlifdi after DN ?IFC estim lumbar with lumbar heat x 15', 20v ? ?11/18/21: ?Seated hamstring stretch 2x20  ?Seated on ball pelvic ROM: 10x each circles Rt/Lt, tail wags, tucks ?Supine piriformis- 2 positions 2x20 each ?Supine pelvic tilt x10 with exhale on tuck ?Supine heel slide in pelvic tilt x 10 Rt/Lt ?Quadruped cat/cow x 10 ?Quadruped rocking Rt/Lt to open posterior hips bil x 3 each way ?Sit to stand from elevated surface 20" mat table with cue for hip hinge and exhale on stand x 5 reps ?Sitting on ball red band bil UE extension x 10 reps, focus on TA ? ? ? ? ?PATIENT EDUCATION: ?Education details: Access Code: [email or web address] (see exercises listed below), use of meditation for stress/pain/relaxation ?Person educated: Patient ?Education method: Explanation, Demonstration, and Handouts ?Education comprehension: verbalized understanding and returned demonstration ? ? ?HOME EXERCISE PROGRAM: ?Access Code: [email or web address] ?URL: [email or web address] ?Date: 11/09/2021 ?Prepared by: Claiborne Billings ? ?Exercises ?Supine Lower Trunk Rotation - 2 x daily - 7 x weekly - 1 sets - 5 reps - 20 hold ?Supine Figure 4 Piriformis Stretch - 2 x daily - 7 x weekly - 1 sets - 3 reps - 20 hold ?Seated Figure 4 Piriformis Stretch - 2 x daily -  7 x weekly - 1 sets - 3 reps - 20 hold ?Supine Piriformis Stretch with Leg Straight - 2 x daily - 7 x weekly - 1 sets - 3 reps ?Supine Hip Adduction Isometric with Ball - 2 x daily - 7 x weekly - 1 sets - 10 reps - 5 hold ?Supine Transversus Abdominis Bracing with Double Leg Fallout  - 2 x daily - 7 x weekly - 1 sets - 10 reps ?  ? ?11/03/2021: ?Pt education/self-care:  ?Education about anatomy and role of pelvic floor and deep core ?importance of pressure management and proper core engagement before progression of dynamic strength training to protect ongoing pelvic organ prolapse ?Diaphragmatic breathing intro ?Neuro-reed:  ?standing and SL diaphragmatic breathing with VC and TC ?Add TA indraw and PF lift, VC used and PT used external palpation of posterior PF to validate proper lift with Pt's permission ?HEP: work on diaphragmatic breathing with TA/PF activation on exhale in variety of positions, can use self-palpation for feedback as instructed by PT ? ?  ?HOME EXERCISE PROGRAM: ?Access Code: [email or web address], gave DN info ?  ?ASSESSMENT: ?  ?CLINICAL IMPRESSION: ?Pt reports pain reduction after PT each session but this does not last.  Pt had MRI recently with new findings so will see a specialist next week.  Pt reports that muscles feel less tense but no change in overall pain.  Pt tolerated gentle exercise in the clinic and responds well to electrical stimulation at the end of session.  Pt will see MD next week and decision will be made regarding future PT. ?REHAB POTENTIAL: Good ?  ?CLINICAL DECISION MAKING: Stable/uncomplicated ?  ?EVALUATION COMPLEXITY: Low ?  ?  ?GOALS: ?Goals reviewed with patient? Yes ?  ?SHORT TERM GOALS: ?  ?STG Name Target Date Goal status  ?1 Pt will be able to demo proper understanding of how to engage core in various positions and report success with pelvic floor lift with verbal cueing by PT ?Baseline:  11/16/2021 MET  ?2 Pt will be ind with initial HEP to improve LE flexibility, LE strength, and core strength. ?Baseline:  11/23/2021 MET  ?3 Pt will report reduced pain by at least 20% with sitting, standing tasks, and walking x 30' or more. ?Baseline: 20 min sitting and standing  11/23/2021 INITIAL  ?4   ?Baseline:      ?5   ?Baseline:      ?6   ?Baseline:      ?7    ?Baseline:      ?  ?LONG TERM GOALS:  ?  ?LTG Name Target Date Goal status  ?1 Pt will be ind with advanced HEP and understand how to safely progress ?Baseline: 12/21/2021 INITIAL  ?2 Pt will report at least 70% improvement in tolerance of light to moderate household tasks. ?Baseline: 12/21/2021 INITIAL  ?3 Pt will improve LE strength and core strength to at least 4+/5 bil for improved squat, stairs, and gait endurance. ?Baseline: 12/21/2021 INITIAL  ?4 Pt able to demo proper bend/lift body mechanics without pain for household tasks and shopping. ?Baseline: 12/21/2021 INITIAL  ?5 Improve FOTO to at least 56% to demo improved function. ?Baseline: 46% 12/21/2021 INITIAL  ?6   ?Baseline:      ?7   ?Baseline:      ?  ?PLAN: ?PT FREQUENCY: 2x/week ?  ?PT DURATION: 8 weeks ?  ?PLANNED INTERVENTIONS: Therapeutic exercises, Therapeutic activity, Neuro Muscular re-education, Balance training, Gait training, Patient/Family education, Joint mobilization, Dry Needling, Electrical stimulation, Spinal  mobilization, Cryotherapy, Moist heat, Taping, and Manual therapy ?  ?PLAN FOR NEXT SESSION:  Pt sees specialist next week and will likely D/C from PT is significant changes not seen by then.   ? ?Sigurd Sos, PT ?11/25/21 11:53 AM  ? ? ? ?  ? ?

## 2021-11-30 ENCOUNTER — Ambulatory Visit: Payer: Medicaid Other | Admitting: Physical Therapy

## 2021-11-30 ENCOUNTER — Encounter: Payer: Self-pay | Admitting: Physical Therapy

## 2021-11-30 ENCOUNTER — Other Ambulatory Visit: Payer: Self-pay

## 2021-11-30 DIAGNOSIS — R293 Abnormal posture: Secondary | ICD-10-CM

## 2021-11-30 DIAGNOSIS — M545 Low back pain, unspecified: Secondary | ICD-10-CM | POA: Diagnosis not present

## 2021-11-30 DIAGNOSIS — M6281 Muscle weakness (generalized): Secondary | ICD-10-CM

## 2021-11-30 DIAGNOSIS — G8929 Other chronic pain: Secondary | ICD-10-CM

## 2021-11-30 DIAGNOSIS — R252 Cramp and spasm: Secondary | ICD-10-CM

## 2021-11-30 NOTE — Therapy (Signed)
?OUTPATIENT PHYSICAL THERAPY TREATMENT NOTE ? ? ?Patient Name: Cathy Collins ?MRN: [id number] ?DOB:[birth year], 46 y.o., female ?Today's Date: 11/30/2021 ? ?PCP: [facility] ?REFERRING PROVIDER: Doyle Askew, PA-C ? ? PT End of Session - 11/23/21 1020   ? ? Visit Number 8  ? Date for PT Re-Evaluation 12/21/21   ? Authorization Type [employer] Medicaid   ? Authorization - Visit Number 6  ? Authorization - Number of Visits 27   ? PT Start Time Start time: 10:19 ?Finish time 1100 ?Total treatment 40 min  ? Activity Tolerance Patient tolerated treatment well   ? Behavior During Therapy [facility] for tasks assessed/performed   ? ?  ?  ? ?  ? ? ? ? ?Past Medical History:  ?Diagnosis Date  ? Vaginal Pap smear, abnormal   ? ?History reviewed. No pertinent surgical history. ?There are no problems to display for this patient. ? ? ?REFERRING DIAG: M54.59 (ICD-10-CM) - Other low back pain  ? ?THERAPY DIAG:  ?Chronic bilateral low back pain without sciatica ?  ?Abnormal posture ?  ?Muscle weakness (generalized) ?  ?Cramp and spasm ? ?PERTINENT HISTORY: Pelvic organ prolapse Stage 2, recently diagnosed, no treatment to date  ? ?PRECAUTIONS: none ? ?SUBJECTIVE: Relief with PT continues to be short term.  Rest helps but I can't always rest.  I see the specialist today.   ? ?PAIN:  ?Are you having pain? Yes ?NPRS scale: 4/10 ?Pain location: lumbar/gliuteals  ?Pain orientation: central lumbar ?PAIN TYPE: aching, cramping ?Pain description: aching  ?Aggravating factors: activity ?Relieving factors: unsure, limiting activity ? ? ? ?TODAY'S TREATMENT  ? ?Treatment on date: 11/30/21 ?Nustep L2 x 2' - d/c early due to Pt report of Lt LBP/LE pain ?Trunk rotation: 2x20" bil ?Supine Lt piriformis stretch 1x30" ?Pelvic tilt with exhale x 10 reps, TA and PF awareness ?Quadruped cat cow x 5 with breath cueing by PT ?Quadruped rocking straight and at diagonals for posterior hip opening x 10 each ?Standing rockerboard ant/post x 1' at barre ?Rt  SLS on foam, Lt hip ext x 10 at barre ?Wall plank on elbows 3x15" ?Seated on blue ball bil shoulder ext and row x 10 each, green band ?IFC estim with lumbar heat x 10' ? ?Treatment on date: 11/25/21 ?NuStep: Level 2x 6 minutes-PT present to discuss progress ?Trunk rotation: 3x20" bil.  ?Supine pelvic tilt with exhale on tuck x10 ?Ball squeeze with TA activation 5" hold x 15 ?Hip abduction: yellow band with TA activation in supine x15 ? IFC estim lumbar with lumbar heat x 15' ?11/23/21: ?Trigger Point Dry-Needling #2 ?Treatment instructions: Expect mild to moderate muscle soreness. S/S of pneumothorax if dry needled over a lung field, and to seek immediate medical attention should they occur. Patient verbalized understanding of these instructions and education. ?Patient Consent Given: Yes ?Education handout provided: Previously provided ?Muscles treated: lumbar multifidi bil ?Electrical stimulation performed: No ?Parameters: N/A ?Treatment response/outcome: twitch and elongation bil ?Manual therapy: STM lumbar paraspinals, gluteals and mutlifdi after DN ?IFC estim lumbar with lumbar heat x 15', 20v ? ? ?PATIENT EDUCATION: ?Education details: Access Code: [email or web address] (see exercises listed below), use of meditation for stress/pain/relaxation ?Person educated: Patient ?Education method: Explanation, Demonstration, and Handouts ?Education comprehension: verbalized understanding and returned demonstration ? ? ?HOME EXERCISE PROGRAM: ?Access Code: [email or web address] ?URL: [email or web address] ?Date: 11/09/2021 ?Prepared by: Claiborne Billings ? ?Exercises ?Supine Lower Trunk Rotation - 2 x daily - 7 x weekly - 1 sets - 5 reps - 20 hold ?Supine Figure 4  Piriformis Stretch - 2 x daily - 7 x weekly - 1 sets - 3 reps - 20 hold ?Seated Figure 4 Piriformis Stretch - 2 x daily - 7 x weekly - 1 sets - 3 reps - 20 hold ?Supine Piriformis Stretch with Leg Straight - 2 x daily - 7 x weekly - 1 sets - 3 reps ?Supine Hip Adduction Isometric with  Ball - 2 x daily - 7 x weekly - 1 sets - 10 reps - 5 hold ?Supine Transversus Abdominis Bracing with Double Leg Fallout - 2 x daily - 7 x weekly - 1 sets - 10 reps ?  ? ?11/03/2021: ?Pt education/self-care:  ?Education about anatomy and role of pelvic floor and deep core ?importance of pressure management and proper core engagement before progression of dynamic strength training to protect ongoing pelvic organ prolapse ?Diaphragmatic breathing intro ?Neuro-reed:  ?standing and SL diaphragmatic breathing with VC and TC ?Add TA indraw and PF lift, VC used and PT used external palpation of posterior PF to validate proper lift with Pt's permission ?HEP: work on diaphragmatic breathing with TA/PF activation on exhale in variety of positions, can use self-palpation for feedback as instructed by PT ? ?  ?HOME EXERCISE PROGRAM: ?Access Code: [email or web address], gave DN info ?  ?ASSESSMENT: ?  ?CLINICAL IMPRESSION: ?Pt reports pain reduction after PT each session but this does not last.  Pt sees specialist today for her ongoing LBP.  Unfortunately her Lt LE from hip to thigh was set off doing warm up on NuStep and did not recover within session.  She was still able to participate in stretches, spine ROM and strength but reported increased Lt LE pain to 6/10.  Continued use of heat and estim today given positive response over past 2 sessions.  Pt does note improved awareness of deep core including pelvic floor with pelvic tilts and wall plank.  Discuss specialist appt next visit and determine plan for ongoing PT. ? ? ?REHAB POTENTIAL: Good ?  ?CLINICAL DECISION MAKING: Stable/uncomplicated ?  ?EVALUATION COMPLEXITY: Low ?  ?  ?GOALS: ?Goals reviewed with patient? Yes ?  ?SHORT TERM GOALS: ?  ?STG Name Target Date Goal status  ?1 Pt will be able to demo proper understanding of how to engage core in various positions and report success with pelvic floor lift with verbal cueing by PT ?Baseline:  11/16/2021 MET  ?2 Pt will be ind with initial  HEP to improve LE flexibility, LE strength, and core strength. ?Baseline:  11/23/2021 MET  ?3 Pt will report reduced pain by at least 20% with sitting, standing tasks, and walking x 30' or more. ?Baseline: 20 min sitting and standing  11/23/2021 INITIAL  ?4   ?Baseline:      ?5   ?Baseline:      ?6   ?Baseline:      ?7   ?Baseline:      ?  ?LONG TERM GOALS:  ?  ?LTG Name Target Date Goal status  ?1 Pt will be ind with advanced HEP and understand how to safely progress ?Baseline: 12/21/2021 INITIAL  ?2 Pt will report at least 70% improvement in tolerance of light to moderate household tasks. ?Baseline: 12/21/2021 INITIAL  ?3 Pt will improve LE strength and core strength to at least 4+/5 bil for improved squat, stairs, and gait endurance. ?Baseline: 12/21/2021 INITIAL  ?4 Pt able to demo proper bend/lift body mechanics without pain for household tasks and shopping. ?Baseline: 12/21/2021 INITIAL  ?5 Improve FOTO to  at least 56% to demo improved function. ?Baseline: 46% 12/21/2021 INITIAL  ?6   ?Baseline:      ?7   ?Baseline:      ?  ?PLAN: ?PT FREQUENCY: 2x/week ?  ?PT DURATION: 8 weeks ?  ?PLANNED INTERVENTIONS: Therapeutic exercises, Therapeutic activity, Neuro Muscular re-education, Balance training, Gait training, Patient/Family education, Joint mobilization, Dry Needling, Electrical stimulation, Spinal mobilization, Cryotherapy, Moist heat, Taping, and Manual therapy ?  ?PLAN FOR NEXT SESSION:  Pt sees specialist next week and will likely D/C from PT is significant changes not seen by then.   ?Baruch Merl, PT ?11/30/21 10:55 AM ? ? ? ? ? ?  ? ?

## 2021-12-02 ENCOUNTER — Encounter: Payer: Self-pay | Admitting: Physical Therapy

## 2021-12-02 ENCOUNTER — Ambulatory Visit: Payer: Medicaid Other | Admitting: Physical Therapy

## 2021-12-02 ENCOUNTER — Other Ambulatory Visit: Payer: Self-pay

## 2021-12-02 DIAGNOSIS — M545 Low back pain, unspecified: Secondary | ICD-10-CM

## 2021-12-02 DIAGNOSIS — M6281 Muscle weakness (generalized): Secondary | ICD-10-CM

## 2021-12-02 DIAGNOSIS — R252 Cramp and spasm: Secondary | ICD-10-CM

## 2021-12-02 DIAGNOSIS — G8929 Other chronic pain: Secondary | ICD-10-CM

## 2021-12-02 DIAGNOSIS — R293 Abnormal posture: Secondary | ICD-10-CM

## 2021-12-02 NOTE — Therapy (Signed)
?OUTPATIENT PHYSICAL THERAPY TREATMENT NOTE ? ? ?Patient Name: Cathy Collins ?MRN: [id number] ?DOB:Nov 09, 1975, 46 y.o., female ?Today's Date: 12/02/2021 ? ?PCP: [facility], [facility] ?REFERRING PROVIDER: Group, [place] ? ? PT End of Session - 11/23/21 1020   ? ? Visit Number 9  ? Date for PT Re-Evaluation 12/21/21   ? Authorization Type [employer] Medicaid   ? Authorization - Visit Number 9  ? Authorization - Number of Visits 27   ? PT Start Time Start time: 11:02 ?Finish time 11:36 ?Total treatment 34 min  ? Activity Tolerance Patient tolerated treatment well   ? Behavior During Therapy [facility] for tasks assessed/performed   ? ?  ?  ? ?  ? ? ? ? ?Past Medical History:  ?Diagnosis Date  ? Vaginal Pap smear, abnormal   ? ?History reviewed. No pertinent surgical history. ?There are no problems to display for this patient. ? ? ?REFERRING DIAG: M54.59 (ICD-10-CM) - Other low back pain  ? ?THERAPY DIAG:  ?Chronic bilateral low back pain without sciatica ?  ?Abnormal posture ?  ?Muscle weakness (generalized) ?  ?Cramp and spasm ? ?PERTINENT HISTORY: Pelvic organ prolapse Stage 2, recently diagnosed, no treatment to date  ? ?PRECAUTIONS: none ? ?SUBJECTIVE: I saw the specialist on Tuesday and they brought me back yesterday and did SI injections on both sides. My pain from those is really bad.  I couldn't sleep well.  Any pressure of sitting or laying on my back is bad.   ? ?PAIN:  ?Are you having pain? Yes ?NPRS scale: 8/10 ?Pain location: lumbar/gliuteals  ?Pain orientation: central lumbar ?PAIN TYPE: aching, cramping ?Pain description: aching  ?Aggravating factors: activity ?Relieving factors: unsure, limiting activity ? ? ? ?TODAY'S TREATMENT  ?12/02/21: ?IFC estim x 20' with lumbar colpac, prone with pillow under pelvis for comfort ?Pt education: provided home TENS unit info, expected course of response to injections, planning for future visits (potential need to put Pt on hold due to pain limiting participation if no  improvement with injections). ? ?Treatment on date: 11/30/21 ?Nustep L2 x 2' - d/c early due to Pt report of Lt LBP/LE pain ?Trunk rotation: 2x20" bil ?Supine Lt piriformis stretch 1x30" ?Pelvic tilt with exhale x 10 reps, TA and PF awareness ?Quadruped cat cow x 5 with breath cueing by PT ?Quadruped rocking straight and at diagonals for posterior hip opening x 10 each ?Standing rockerboard ant/post x 1' at barre ?Rt SLS on foam, Lt hip ext x 10 at barre ?Wall plank on elbows 3x15" ?Seated on blue ball bil shoulder ext and row x 10 each, green band ?IFC estim with lumbar heat x 10' ? ?Treatment on date: 11/25/21 ?NuStep: Level 2x 6 minutes-PT present to discuss progress ?Trunk rotation: 3x20" bil.  ?Supine pelvic tilt with exhale on tuck x10 ?Ball squeeze with TA activation 5" hold x 15 ?Hip abduction: yellow band with TA activation in supine x15 ?IFC estim lumbar with lumbar heat x 15' ? ? ? ?PATIENT EDUCATION: ?Education details: Access Code: [email or web address] (see exercises listed below), use of meditation for stress/pain/relaxation, home TENS unit info ?Person educated: Patient ?Education method: Explanation, Demonstration, and Handouts ?Education comprehension: verbalized understanding and returned demonstration ? ? ?HOME EXERCISE PROGRAM: ?Access Code: [email or web address] ?URL: [email or web address] ?Date: 11/09/2021 ?Prepared by: Claiborne Billings ? ?Exercises ?Supine Lower Trunk Rotation - 2 x daily - 7 x weekly - 1 sets - 5 reps - 20 hold ?Supine Figure 4 Piriformis Stretch - 2 x daily - 7 x weekly - 1  sets - 3 reps - 20 hold ?Seated Figure 4 Piriformis Stretch - 2 x daily - 7 x weekly - 1 sets - 3 reps - 20 hold ?Supine Piriformis Stretch with Leg Straight - 2 x daily - 7 x weekly - 1 sets - 3 reps ?Supine Hip Adduction Isometric with Ball - 2 x daily - 7 x weekly - 1 sets - 10 reps - 5 hold ?Supine Transversus Abdominis Bracing with Double Leg Fallout - 2 x daily - 7 x weekly - 1 sets - 10 reps ?  ? ? ? ?  ?HOME EXERCISE  PROGRAM: ?Access Code: [place], gave DN info ?  ?ASSESSMENT: ?  ?CLINICAL IMPRESSION: ?Pt saw specialist and received bil SI joint injections yesterday.  Since then Pt has been unable to sit or lay supine (affecting sleep) due to heightened pain and sensitivity.  Short session today with modalities and positioning for relief using IFC and colpac.  Discussed expected course of response after injections that soreness should lift within 1-2 days and full effect of injection can take 7-10 days.  Discussed potential need to put Pt on hold due to pain limiting participation if no improvement with injections.  PT gave handout for home TENS unit to purchase if she feels it has been helpful in sessions. ? ? ?REHAB POTENTIAL: Good ?  ?CLINICAL DECISION MAKING: Stable/uncomplicated ?  ?EVALUATION COMPLEXITY: Low ?  ?  ?GOALS: ?Goals reviewed with patient? Yes ?  ?SHORT TERM GOALS: ?  ?STG Name Target Date Goal status  ?1 Pt will be able to demo proper understanding of how to engage core in various positions and report success with pelvic floor lift with verbal cueing by PT ?Baseline:  11/16/2021 MET  ?2 Pt will be ind with initial HEP to improve LE flexibility, LE strength, and core strength. ?Baseline:  11/23/2021 MET  ?3 Pt will report reduced pain by at least 20% with sitting, standing tasks, and walking x 30' or more. ?Baseline: 20 min sitting and standing  11/23/2021 INITIAL  ?4   ?Baseline:      ?5   ?Baseline:      ?6   ?Baseline:      ?7   ?Baseline:      ?  ?LONG TERM GOALS:  ?  ?LTG Name Target Date Goal status  ?1 Pt will be ind with advanced HEP and understand how to safely progress ?Baseline: 12/21/2021 INITIAL  ?2 Pt will report at least 70% improvement in tolerance of light to moderate household tasks. ?Baseline: 12/21/2021 INITIAL  ?3 Pt will improve LE strength and core strength to at least 4+/5 bil for improved squat, stairs, and gait endurance. ?Baseline: 12/21/2021 INITIAL  ?4 Pt able to demo proper bend/lift  body mechanics without pain for household tasks and shopping. ?Baseline: 12/21/2021 INITIAL  ?5 Improve FOTO to at least 56% to demo improved function. ?Baseline: 46% 12/21/2021 INITIAL  ?6   ?Baseline:      ?7   ?Baseline:      ?  ?PLAN: ?PT FREQUENCY: 2x/week ?  ?PT DURATION: 8 weeks ?  ?PLANNED INTERVENTIONS: Therapeutic exercises, Therapeutic activity, Neuro Muscular re-education, Balance training, Gait training, Patient/Family education, Joint mobilization, Dry Needling, Electrical stimulation, Spinal mobilization, Cryotherapy, Moist heat, Taping, and Manual therapy ?  ?PLAN FOR NEXT SESSION:  Have injections helped (bil SI injections 12/01/21)?, progress flexibility, core and LE strength as tol, consider putting Pt on hold if she doesn't feel enough pain relief to participate in  PT ? ?Baruch Merl, PT ?12/02/21 11:37 AM ? ?  ? ? ? ? ? ? ? ?  ? ?

## 2021-12-07 ENCOUNTER — Other Ambulatory Visit: Payer: Self-pay

## 2021-12-07 ENCOUNTER — Ambulatory Visit: Payer: Medicaid Other

## 2021-12-07 DIAGNOSIS — M545 Low back pain, unspecified: Secondary | ICD-10-CM

## 2021-12-07 DIAGNOSIS — R293 Abnormal posture: Secondary | ICD-10-CM

## 2021-12-07 DIAGNOSIS — M6281 Muscle weakness (generalized): Secondary | ICD-10-CM

## 2021-12-07 DIAGNOSIS — R252 Cramp and spasm: Secondary | ICD-10-CM

## 2021-12-07 NOTE — Therapy (Addendum)
?OUTPATIENT PHYSICAL THERAPY TREATMENT NOTE ? ? ?Patient Name: Cathy Collins ?MRN: [id number] ?DOB:February 20, 1976, 46 y.o., female ?Today's Date: 12/07/2021 ? ?PCP: [facility] ?REFERRING PROVIDER: Lorenza Evangelist, PAC ? ? ? PT End of Session - 11/23/21 1020   ? ? Visit Number 10  ? Date for PT Re-Evaluation 12/21/21   ? Authorization Type [employer] Medicaid   ? Authorization - Visit Number 9  ? Authorization - Number of Visits 27   ? PT Start Time Start time: 10:19 ?Finish time 10:53 ?Total treatment 34 min  ? Activity Tolerance Patient tolerated treatment well   ? Behavior During Therapy [facility] for tasks assessed/performed   ? ?  ?  ? ?  ? ? ? ? ?Past Medical History:  ?Diagnosis Date  ? Vaginal Pap smear, abnormal   ? ?No past surgical history on file. ?There are no problems to display for this patient. ? ? ?REFERRING DIAG: M54.59 (ICD-10-CM) - Other low back pain  ? ?THERAPY DIAG:  ?Chronic bilateral low back pain without sciatica ?  ?Abnormal posture ?  ?Muscle weakness (generalized) ?  ?Cramp and spasm ? ?PERTINENT HISTORY: Pelvic organ prolapse Stage 2, recently diagnosed, no treatment to date  ? ?PRECAUTIONS: none ? ?SUBJECTIVE: I am feeling a little better today. I was really in a lot of pain for 2 days after the injection.  Pain is coming and going now.  I feel like PT is not helping much.   ?PAIN:  ?Are you having pain? Yes ?NPRS scale: 4/10 ?Pain location: lumbar/gliuteals  ?Pain orientation: central lumbar ?PAIN TYPE: aching, cramping ?Pain description: aching  ?Aggravating factors: activity ?Relieving factors: unsure, limiting activity ? ?TODAY'S TREATMENT  ?Treatment on date: 12/07/21 ?Nustep L2 x 6' - PT present to discuss status ?Trunk rotation: 2x20" bil ?Supine Lt piriformis stretch 2x30" ?TA activation with ball squeeze and clamshell with yellow band x10 ? ?12/02/21: ?IFC estim x 20' with lumbar colpac, prone with pillow under pelvis for comfort ?Pt education: provided home TENS unit info, expected  course of response to injections, planning for future visits (potential need to put Pt on hold due to pain limiting participation if no improvement with injections). ? ?Treatment on date: 11/30/21 ?Nustep L2 x 2' - d/c early due to Pt report of Lt LBP/LE pain ?Trunk rotation: 2x20" bil ?Supine Lt piriformis stretch 1x30" ?Pelvic tilt with exhale x 10 reps, TA and PF awareness ?Quadruped cat cow x 5 with breath cueing by PT ?Quadruped rocking straight and at diagonals for posterior hip opening x 10 each ?Standing rockerboard ant/post x 1' at barre ?Rt SLS on foam, Lt hip ext x 10 at barre ?Wall plank on elbows 3x15" ?Seated on blue ball bil shoulder ext and row x 10 each, green band ?IFC estim with lumbar heat x 10' ? ?PATIENT EDUCATION: ?Education details: Access Code: [email or web address] (see exercises listed below), use of meditation for stress/pain/relaxation, home TENS unit info ?Person educated: Patient ?Education method: Explanation, Demonstration, and Handouts ?Education comprehension: verbalized understanding and returned demonstration ? ?HOME EXERCISE PROGRAM: ?Access Code: [place] ?URL: [email or web address] ?Date: 11/09/2021 ?Prepared by: Claiborne Billings ? ?Exercises ?Supine Lower Trunk Rotation - 2 x daily - 7 x weekly - 1 sets - 5 reps - 20 hold ?Supine Figure 4 Piriformis Stretch - 2 x daily - 7 x weekly - 1 sets - 3 reps - 20 hold ?Seated Figure 4 Piriformis Stretch - 2 x daily - 7 x weekly - 1 sets - 3 reps - 20 hold ?Supine  Piriformis Stretch with Leg Straight - 2 x daily - 7 x weekly - 1 sets - 3 reps ?Supine Hip Adduction Isometric with Ball - 2 x daily - 7 x weekly - 1 sets - 10 reps - 5 hold ?Supine Transversus Abdominis Bracing with Double Leg Fallout - 2 x daily - 7 x weekly - 1 sets - 10 reps ? ?  ?HOME EXERCISE PROGRAM: ?Access Code: [email or web address], gave DN info ?  ?ASSESSMENT: ?  ?CLINICAL IMPRESSION: ?Pt will D/C today to HEP.  Pt reports 30% overall improvement in symptoms since the start of care.  Pt  has HEP in place for strength and flexibility and she is making modifications at home.  Pt had SI joint injections last week and reports a minor reduction in the intensity of pain.  Pt continues to report significant limitation in her ability to perform functional mobility.  Pt will D/C to HEP and continue to work on this.  She will follow-up with MD.   ? ?GOALS: ?Goals reviewed with patient? Yes ?  ?SHORT TERM GOALS: ?  ?STG Name Target Date Goal status  ?1 Pt will be able to demo proper understanding of how to engage core in various positions and report success with pelvic floor lift with verbal cueing by PT ?Baseline:  11/16/2021 MET  ?2 Pt will be ind with initial HEP to improve LE flexibility, LE strength, and core strength. ?Baseline:  11/23/2021 MET  ?3 Pt will report reduced pain by at least 20% with sitting, standing tasks, and walking x 30' or more. ?Baseline: 20 min sitting and standing  11/23/2021 Partially met  ?  ?LONG TERM GOALS:  ?  ?LTG Name Target Date Goal status  ?1 Pt will be ind with advanced HEP and understand how to safely progress ?Baseline: 12/21/2021 MET  ?2 Pt will report at least 70% improvement in tolerance of light to moderate household tasks. ?Baseline: 30% 12/07/21 12/21/2021 Partially met  ?3 Pt will improve LE strength and core strength to at least 4+/5 bil for improved squat, stairs, and gait endurance. ?Baseline: 12/21/2021   ?4 Pt able to demo proper bend/lift body mechanics without pain for household tasks and shopping. ?Baseline:  12/21/2021 MET  ?5 Improve FOTO to at least 56% to demo improved function. ?Baseline: 46% (reduced to 36% on 12/07/21) 12/21/2021 Not met  ?  ?PLAN: ?  ?PLAN FOR NEXT SESSION:  D/C PT to HEP ? ?Sigurd Sos, PT ?12/07/21 10:54 AM  ?PHYSICAL THERAPY DISCHARGE SUMMARY ? ?Visits from Start of Care: 10 ? ?Current functional level related to goals / functional outcomes: ?Pt reports 30% reduction in overall pain intensity and frequency. Pt has HEP in place and is  able to perform low level activity.   ?  ?Remaining deficits: ?Pt is significantly limited in her ability to be active and perform daily functional tasks.  Pt will follow-up with MD as needed.   ?  ?Education / Equipment: ?HEP, body mechanics   ? ?Patient agrees to discharge. Patient goals were partially met. Patient is being discharged due to lack of progress.  ? Sigurd Sos, PT ?12/07/21 10:54 AM  ? ? ? ? ? ? ? ?  ? ?

## 2021-12-09 ENCOUNTER — Encounter: Payer: Medicaid Other | Admitting: Physical Therapy

## 2021-12-14 ENCOUNTER — Encounter: Payer: Medicaid Other | Admitting: Physical Therapy

## 2021-12-16 ENCOUNTER — Encounter: Payer: Medicaid Other | Admitting: Physical Therapy

## 2021-12-21 ENCOUNTER — Ambulatory Visit: Payer: Medicaid Other

## 2021-12-28 ENCOUNTER — Encounter: Payer: Medicaid Other | Admitting: Physical Therapy

## 2022-01-04 ENCOUNTER — Encounter: Payer: Medicaid Other | Admitting: Physical Therapy

## 2022-07-26 ENCOUNTER — Encounter: Payer: Self-pay | Admitting: General Practice

## 2022-09-29 ENCOUNTER — Other Ambulatory Visit (HOSPITAL_BASED_OUTPATIENT_CLINIC_OR_DEPARTMENT_OTHER): Payer: Self-pay | Admitting: Family Medicine

## 2022-09-29 ENCOUNTER — Other Ambulatory Visit (HOSPITAL_BASED_OUTPATIENT_CLINIC_OR_DEPARTMENT_OTHER): Payer: Self-pay | Admitting: Physician Assistant

## 2022-09-29 DIAGNOSIS — Z1231 Encounter for screening mammogram for malignant neoplasm of breast: Secondary | ICD-10-CM

## 2022-10-05 ENCOUNTER — Ambulatory Visit (HOSPITAL_BASED_OUTPATIENT_CLINIC_OR_DEPARTMENT_OTHER)
Admission: RE | Admit: 2022-10-05 | Discharge: 2022-10-05 | Disposition: A | Discharge status: Home / Self Care | Payer: Medicaid Other | Source: Ambulatory Visit | Attending: Family Medicine | Admitting: Family Medicine

## 2022-10-05 ENCOUNTER — Encounter (HOSPITAL_BASED_OUTPATIENT_CLINIC_OR_DEPARTMENT_OTHER): Payer: Self-pay

## 2022-10-05 DIAGNOSIS — Z1231 Encounter for screening mammogram for malignant neoplasm of breast: Secondary | ICD-10-CM | POA: Insufficient documentation

## 2022-11-23 ENCOUNTER — Ambulatory Visit: Payer: Medicaid Other | Admitting: Obstetrics & Gynecology

## 2022-11-23 ENCOUNTER — Encounter: Payer: Self-pay | Admitting: Obstetrics & Gynecology

## 2022-11-23 ENCOUNTER — Other Ambulatory Visit (HOSPITAL_COMMUNITY)
Admission: RE | Admit: 2022-11-23 | Discharge: 2022-11-23 | Disposition: A | Discharge status: Home / Self Care | Payer: Medicaid Other | Source: Ambulatory Visit | Attending: Obstetrics & Gynecology | Admitting: Obstetrics & Gynecology

## 2022-11-23 VITALS — BP 99/57 | HR 81 | Ht 63.0 in | Wt 186.0 lb

## 2022-11-23 DIAGNOSIS — N814 Uterovaginal prolapse, unspecified: Secondary | ICD-10-CM

## 2022-11-23 DIAGNOSIS — N8110 Cystocele, unspecified: Secondary | ICD-10-CM

## 2022-11-23 DIAGNOSIS — Z01419 Encounter for gynecological examination (general) (routine) without abnormal findings: Secondary | ICD-10-CM

## 2022-11-23 DIAGNOSIS — Z3009 Encounter for other general counseling and advice on contraception: Secondary | ICD-10-CM

## 2022-11-23 DIAGNOSIS — Z1211 Encounter for screening for malignant neoplasm of colon: Secondary | ICD-10-CM

## 2022-11-23 MED ORDER — NORETHINDRONE ACET-ETHINYL EST 1-20 MG-MCG PO TABS: 1.0000 | ORAL_TABLET | Freq: Every day | ORAL | 11 refills | Status: AC

## 2022-11-23 NOTE — Progress Notes (Signed)
Subjective:     Cathy Collins is a 47 y.o. female here for a routine exam.  Current complaints: Pt reports cycles monthly. Pt is in a monogamous relationship for many years. She is on no contraception. She denies new GYN issues but, reports continued pelvic pressure sx and urinary freq from uterine and bladder prolapse. Officially dx'd at her last visit. Pt denies incontinence or AUB. She is s/p SVD x2. No complicated delivered. She is not aware of a maternal h/o prolapse.   She reports a morning cup of coffee and an occ soda.   Pt is interested in beginning contraception. She was on OCPs years ago. No adverse side effects. No contraindication to OCPs   Gynecologic History Patient's last menstrual period was 10/25/2022 (exact date). Contraception: condoms Last Pap: 08/29/2021. Results were: normal Last mammogram: 10/06/2022. Results were: normal  Obstetric History OB History  Gravida Para Term Preterm AB Living  '4 2 2   2 2  '$ SAB IAB Ectopic Multiple Live Births          2    # Outcome Date GA Lbr Len/2nd Weight Sex Delivery Anes PTL Lv  4 AB           3 AB           2 Term      Vag-Spont     1 Term      Vag-Spont        The following portions of the patient's history were reviewed and updated as appropriate: allergies, current medications, past family history, past medical history, past social history, past surgical history, and problem list.  Review of Systems Pertinent items are noted in HPI.    Objective:  BP (!) 99/57   Pulse 81   Ht '5\' 3"'$  (1.6 m)   Wt 186 lb (84.4 kg)   LMP 10/25/2022 (Exact Date)   BMI 32.95 kg/m   General Appearance:    Alert, cooperative, no distress, appears stated age  Head:    Normocephalic, without obvious abnormality, atraumatic  Eyes:    conjunctiva/corneas clear, EOM's intact, both eyes  Ears:    Normal external ear canals, both ears  Nose:   Nares normal, septum midline, mucosa normal, no drainage    or sinus tenderness  Throat:   Lips,  mucosa, and tongue normal; teeth and gums normal  Neck:   Supple, symmetrical, trachea midline, no adenopathy;    thyroid:  no enlargement/tenderness/nodules  Back:     Symmetric, no curvature, ROM normal, no CVA tenderness  Lungs:     respirations unlabored  Chest Wall:    No tenderness or deformity   Heart:    Regular rate and rhythm  Breast Exam:    No tenderness, masses, or nipple abnormality  Abdomen:     Soft, non-tender, bowel sounds active all four quadrants,    no masses, no organomegaly  Genitalia:    Normal female without lesion, discharge or tenderness   Grade 2 uterine prolapse with mod cystocele. No leakage even with valsalva.    Extremities:   Extremities normal, atraumatic, no cyanosis or edema  Pulses:   2+ and symmetric all extremities  Skin:   Skin color, texture, turgor normal, no rashes or lesions     Assessment:    Healthy female exam.  Pelvic prolapse- d/w pt PT, pessary and surgery vs observation. Pt wants to wait on any management for now.    Plan:   Cathy Collins was seen today  for gynecologic exam.  Diagnoses and all orders for this visit:  Well female exam with routine gynecological exam -     Cytology - PAP( [facility])  Screening for colon cancer -     Ambulatory referral to Gastroenterology  Encounter for counseling regarding contraception -     norethindrone-ethinyl estradiol (LOESTRIN 1/20, 21,) 1-20 MG-MCG tablet; Take 1 tablet by mouth daily.  Uterine prolapse  Bladder prolapse, female, acquired   F/u in 1 year or sooner prn  Cathy Collins, M.D., Cathy Collins

## 2022-11-28 LAB — CYTOLOGY - PAP
Comment: NEGATIVE
Diagnosis: NEGATIVE
High risk HPV: NEGATIVE

## 2023-01-31 IMAGING — MG MM DIGITAL SCREENING BILAT W/ TOMO AND CAD
8 series · 8 of 24 positions shown · non-contrast
Comparison: Previous exam(s).

CLINICAL DATA: Screening.

EXAM:
DIGITAL SCREENING BILATERAL MAMMOGRAM WITH TOMOSYNTHESIS AND CAD
TECHNIQUE: Bilateral screening digital craniocaudal and mediolateral oblique
mammograms were obtained. Bilateral screening digital breast
tomosynthesis was performed. The images were evaluated with
computer-aided detection.

[R CC synth-2D]
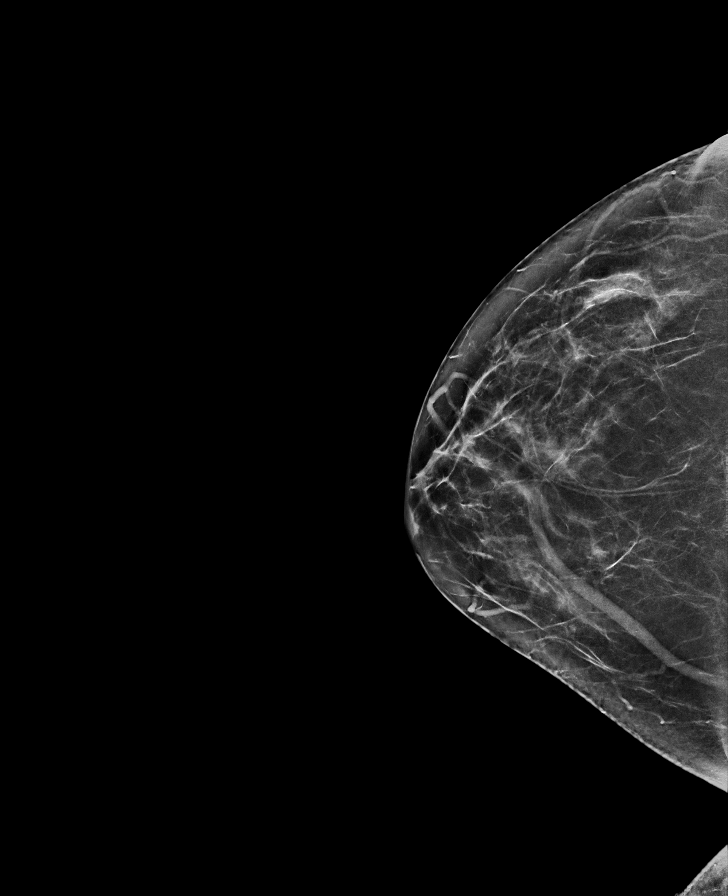

[R MLO synth-2D]
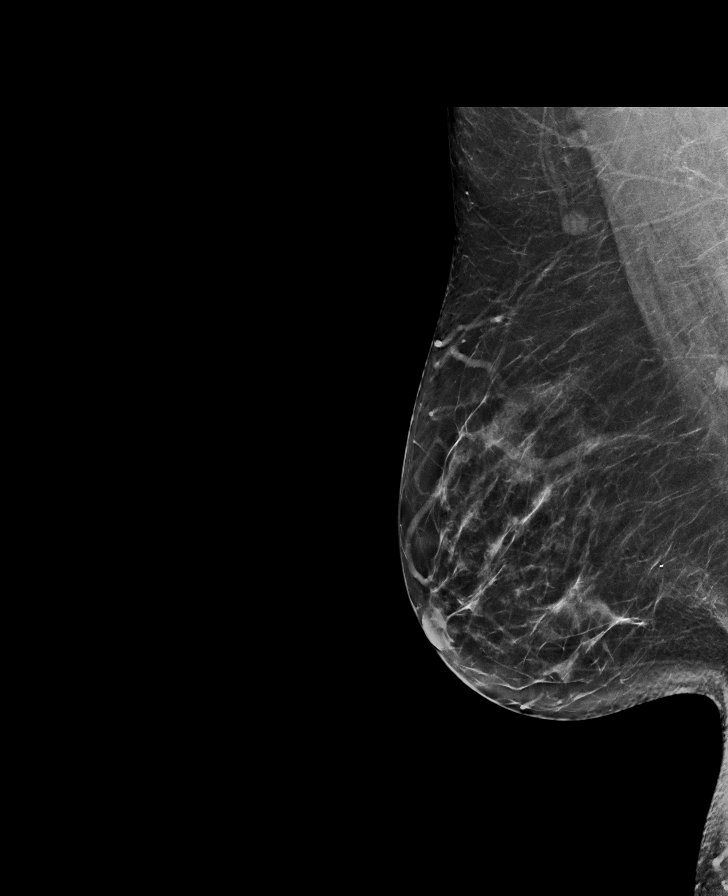

[L MLO synth-2D]
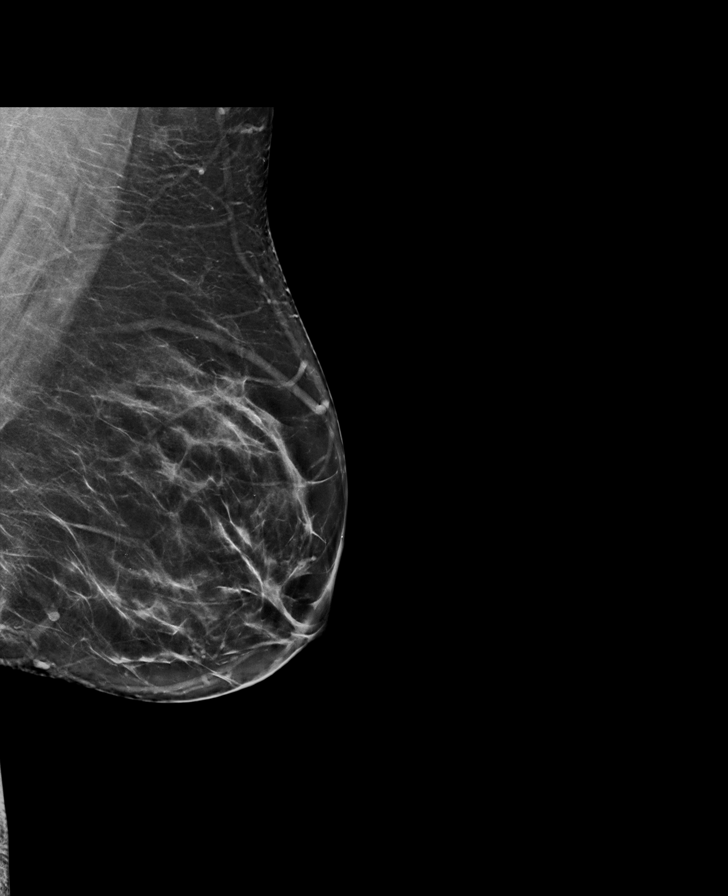

[L CC synth-2D]
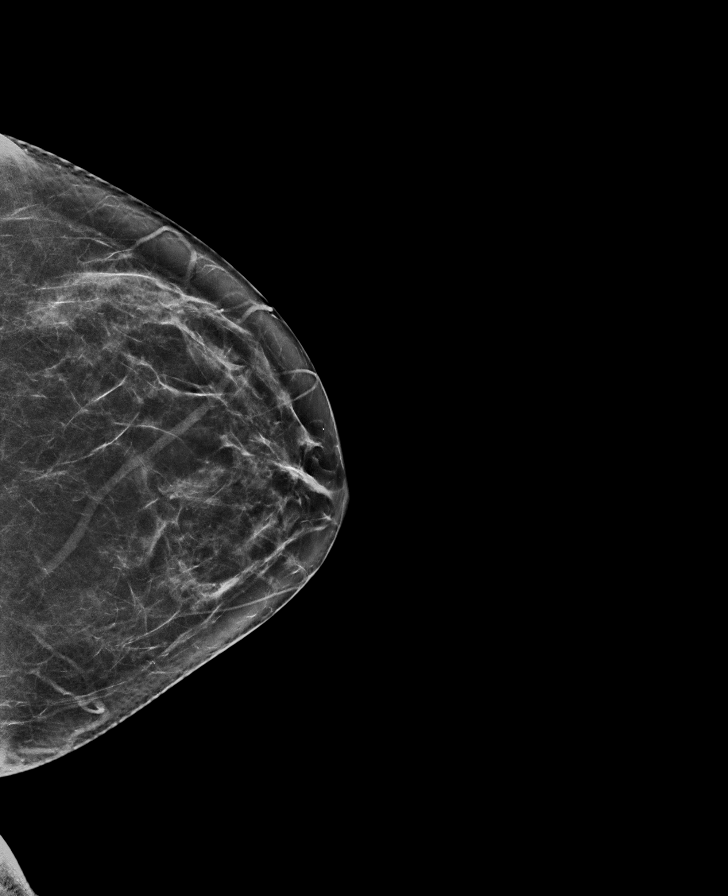

[L CC tomo · tomo slice 33/66.0]
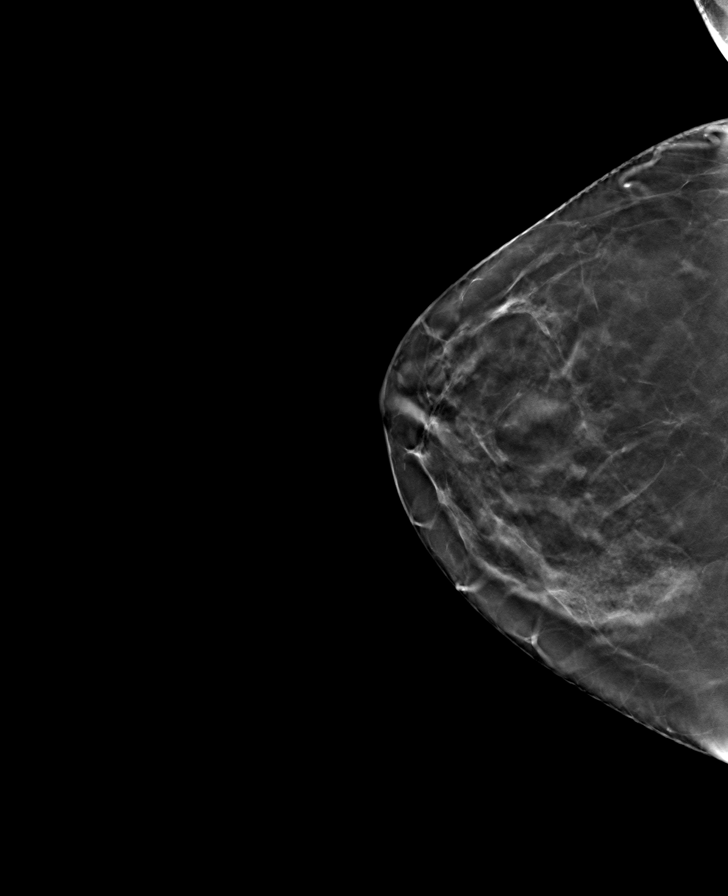

[R CC tomo · tomo slice 35/68.0]
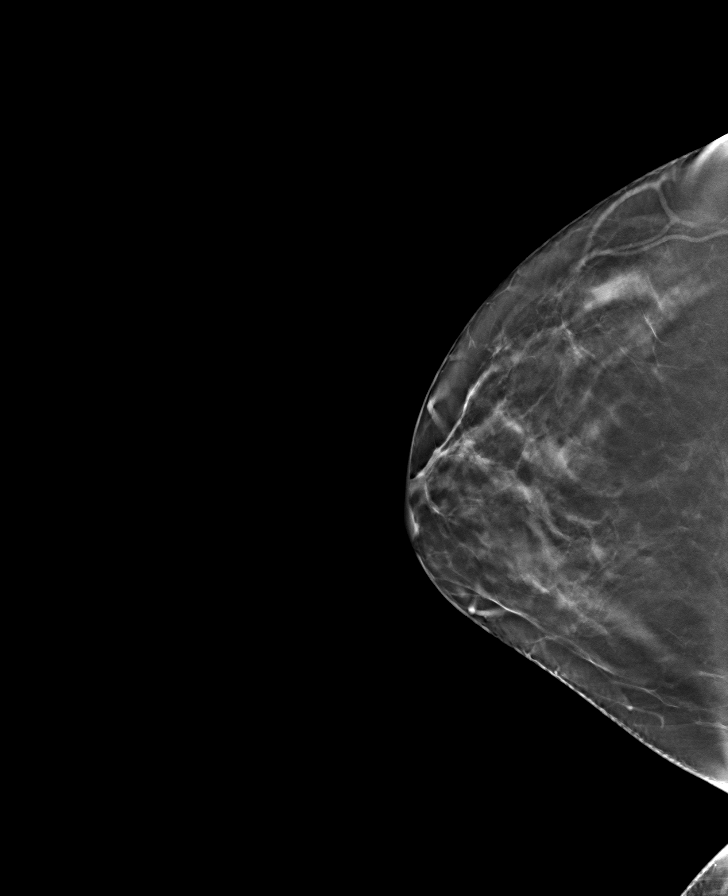

[L MLO tomo · tomo slice 39/76.0]
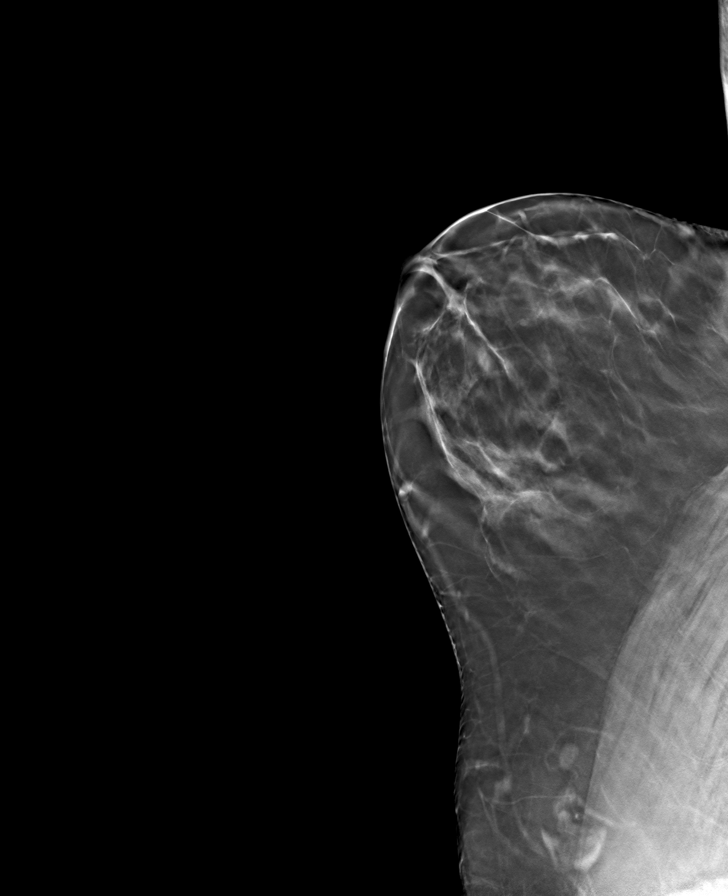

[R MLO tomo · tomo slice 40/79.0]
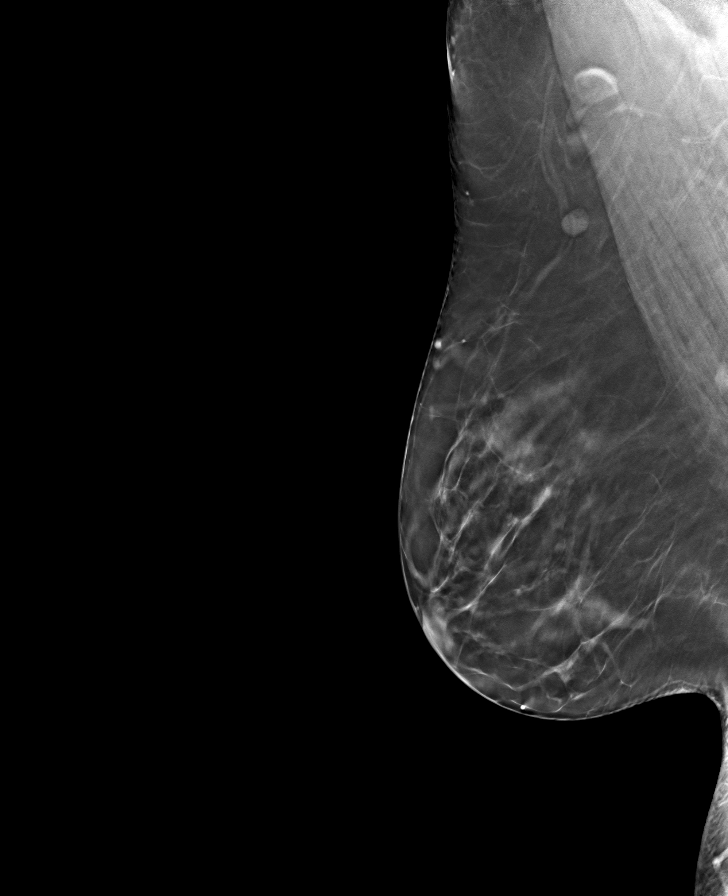

[8 of 24 positions shown; findings below may reference images not displayed]

ACR Breast Density Category b: There are scattered areas of
fibroglandular density.
FINDINGS: There are no findings suspicious for malignancy.
IMPRESSION: No mammographic evidence of malignancy. A result letter of this
screening mammogram will be mailed directly to the patient.

RECOMMENDATION:
Screening mammogram in one year. (Code:51-O-LD2)

BI-RADS CATEGORY  1: Negative.
